# Patient Record
Sex: Female | Born: 1980 | Race: White | Hispanic: No | Smoking: Former smoker
Health system: Southern US, Community
[De-identification: ages and names within clinical notes are randomized; demographics above are authoritative.]

## PROBLEM LIST (undated history)

## (undated) DIAGNOSIS — M1990 Unspecified osteoarthritis, unspecified site: Secondary | ICD-10-CM

## (undated) DIAGNOSIS — F419 Anxiety disorder, unspecified: Secondary | ICD-10-CM

## (undated) DIAGNOSIS — F329 Major depressive disorder, single episode, unspecified: Secondary | ICD-10-CM

## (undated) DIAGNOSIS — M797 Fibromyalgia: Secondary | ICD-10-CM

## (undated) DIAGNOSIS — I10 Essential (primary) hypertension: Secondary | ICD-10-CM

## (undated) DIAGNOSIS — F32A Depression, unspecified: Secondary | ICD-10-CM

## (undated) HISTORY — DX: Major depressive disorder, single episode, unspecified: F32.9

## (undated) HISTORY — DX: Anxiety disorder, unspecified: F41.9

## (undated) HISTORY — DX: Depression, unspecified: F32.A

---

## 1998-01-23 ENCOUNTER — Inpatient Hospital Stay (HOSPITAL_COMMUNITY): Admission: AD | Admit: 1998-01-23 | Discharge: 1998-01-24 | Payer: Self-pay | Admitting: Specialist

## 1998-01-23 ENCOUNTER — Emergency Department (HOSPITAL_COMMUNITY): Admission: EM | Admit: 1998-01-23 | Discharge: 1998-01-23 | Payer: Self-pay | Admitting: Emergency Medicine

## 1998-03-04 ENCOUNTER — Emergency Department (HOSPITAL_COMMUNITY): Admission: EM | Admit: 1998-03-04 | Discharge: 1998-03-04 | Payer: Self-pay | Admitting: Emergency Medicine

## 1999-01-06 ENCOUNTER — Emergency Department (HOSPITAL_COMMUNITY): Admission: EM | Admit: 1999-01-06 | Discharge: 1999-01-06 | Payer: Self-pay | Admitting: Emergency Medicine

## 1999-02-21 ENCOUNTER — Emergency Department (HOSPITAL_COMMUNITY): Admission: EM | Admit: 1999-02-21 | Discharge: 1999-02-21 | Payer: Self-pay | Admitting: Emergency Medicine

## 1999-03-24 ENCOUNTER — Encounter: Payer: Self-pay | Admitting: Family Medicine

## 1999-03-24 ENCOUNTER — Encounter: Admission: RE | Admit: 1999-03-24 | Discharge: 1999-03-24 | Payer: Self-pay | Admitting: Family Medicine

## 1999-05-20 ENCOUNTER — Encounter: Payer: Self-pay | Admitting: *Deleted

## 1999-05-20 ENCOUNTER — Inpatient Hospital Stay (HOSPITAL_COMMUNITY): Admission: AD | Admit: 1999-05-20 | Discharge: 1999-05-20 | Payer: Self-pay | Admitting: *Deleted

## 1999-05-21 ENCOUNTER — Inpatient Hospital Stay (HOSPITAL_COMMUNITY): Admission: AD | Admit: 1999-05-21 | Discharge: 1999-05-21 | Payer: Self-pay | Admitting: Obstetrics

## 1999-06-26 ENCOUNTER — Inpatient Hospital Stay (HOSPITAL_COMMUNITY): Admission: AD | Admit: 1999-06-26 | Discharge: 1999-06-26 | Payer: Self-pay | Admitting: Obstetrics & Gynecology

## 1999-10-24 ENCOUNTER — Inpatient Hospital Stay (HOSPITAL_COMMUNITY): Admission: AD | Admit: 1999-10-24 | Discharge: 1999-10-24 | Payer: Self-pay | Admitting: Obstetrics and Gynecology

## 1999-11-06 ENCOUNTER — Other Ambulatory Visit: Admission: RE | Admit: 1999-11-06 | Discharge: 1999-11-06 | Payer: Self-pay | Admitting: Obstetrics and Gynecology

## 2000-02-02 ENCOUNTER — Inpatient Hospital Stay (HOSPITAL_COMMUNITY): Admission: AD | Admit: 2000-02-02 | Discharge: 2000-02-02 | Payer: Self-pay | Admitting: Obstetrics and Gynecology

## 2000-02-09 ENCOUNTER — Inpatient Hospital Stay (HOSPITAL_COMMUNITY): Admission: AD | Admit: 2000-02-09 | Discharge: 2000-02-09 | Payer: Self-pay | Admitting: Obstetrics and Gynecology

## 2000-03-08 ENCOUNTER — Inpatient Hospital Stay (HOSPITAL_COMMUNITY): Admission: AD | Admit: 2000-03-08 | Discharge: 2000-03-08 | Payer: Self-pay | Admitting: Obstetrics and Gynecology

## 2000-03-09 ENCOUNTER — Ambulatory Visit (HOSPITAL_COMMUNITY): Admission: RE | Admit: 2000-03-09 | Discharge: 2000-03-09 | Payer: Self-pay | Admitting: Obstetrics and Gynecology

## 2000-03-14 ENCOUNTER — Inpatient Hospital Stay (HOSPITAL_COMMUNITY): Admission: AD | Admit: 2000-03-14 | Discharge: 2000-03-14 | Payer: Self-pay | Admitting: Obstetrics and Gynecology

## 2000-03-16 ENCOUNTER — Inpatient Hospital Stay (HOSPITAL_COMMUNITY): Admission: AD | Admit: 2000-03-16 | Discharge: 2000-03-16 | Payer: Self-pay | Admitting: Obstetrics and Gynecology

## 2000-04-17 ENCOUNTER — Inpatient Hospital Stay (HOSPITAL_COMMUNITY): Admission: AD | Admit: 2000-04-17 | Discharge: 2000-04-17 | Payer: Self-pay | Admitting: *Deleted

## 2000-05-03 ENCOUNTER — Inpatient Hospital Stay (HOSPITAL_COMMUNITY): Admission: AD | Admit: 2000-05-03 | Discharge: 2000-05-03 | Payer: Self-pay | Admitting: Obstetrics and Gynecology

## 2000-05-17 ENCOUNTER — Inpatient Hospital Stay (HOSPITAL_COMMUNITY): Admission: AD | Admit: 2000-05-17 | Discharge: 2000-05-17 | Payer: Self-pay | Admitting: Obstetrics & Gynecology

## 2000-06-02 ENCOUNTER — Encounter (INDEPENDENT_AMBULATORY_CARE_PROVIDER_SITE_OTHER): Payer: Self-pay

## 2000-06-02 ENCOUNTER — Inpatient Hospital Stay (HOSPITAL_COMMUNITY): Admission: AD | Admit: 2000-06-02 | Discharge: 2000-06-04 | Payer: Self-pay | Admitting: Obstetrics and Gynecology

## 2000-07-06 ENCOUNTER — Other Ambulatory Visit: Admission: RE | Admit: 2000-07-06 | Discharge: 2000-07-06 | Payer: Self-pay | Admitting: Obstetrics and Gynecology

## 2001-02-02 ENCOUNTER — Other Ambulatory Visit: Admission: RE | Admit: 2001-02-02 | Discharge: 2001-02-02 | Payer: Self-pay | Admitting: Obstetrics and Gynecology

## 2001-10-01 ENCOUNTER — Emergency Department (HOSPITAL_COMMUNITY): Admission: EM | Admit: 2001-10-01 | Discharge: 2001-10-01 | Payer: Self-pay | Admitting: Emergency Medicine

## 2001-10-16 ENCOUNTER — Emergency Department (HOSPITAL_COMMUNITY): Admission: EM | Admit: 2001-10-16 | Discharge: 2001-10-17 | Payer: Self-pay | Admitting: Emergency Medicine

## 2001-10-17 ENCOUNTER — Encounter: Payer: Self-pay | Admitting: Emergency Medicine

## 2002-03-13 ENCOUNTER — Encounter: Payer: Self-pay | Admitting: Emergency Medicine

## 2002-03-13 ENCOUNTER — Emergency Department (HOSPITAL_COMMUNITY): Admission: EM | Admit: 2002-03-13 | Discharge: 2002-03-13 | Payer: Self-pay | Admitting: Emergency Medicine

## 2002-06-14 ENCOUNTER — Emergency Department (HOSPITAL_COMMUNITY): Admission: EM | Admit: 2002-06-14 | Discharge: 2002-06-14 | Payer: Self-pay | Admitting: Emergency Medicine

## 2002-06-21 ENCOUNTER — Inpatient Hospital Stay (HOSPITAL_COMMUNITY): Admission: AD | Admit: 2002-06-21 | Discharge: 2002-06-21 | Payer: Self-pay | Admitting: Obstetrics and Gynecology

## 2005-06-01 ENCOUNTER — Emergency Department (HOSPITAL_COMMUNITY): Admission: EM | Admit: 2005-06-01 | Discharge: 2005-06-01 | Payer: Self-pay | Admitting: Emergency Medicine

## 2006-01-16 ENCOUNTER — Emergency Department (HOSPITAL_COMMUNITY): Admission: EM | Admit: 2006-01-16 | Discharge: 2006-01-16 | Payer: Self-pay | Admitting: Emergency Medicine

## 2008-01-15 ENCOUNTER — Emergency Department (HOSPITAL_COMMUNITY): Admission: EM | Admit: 2008-01-15 | Discharge: 2008-01-15 | Payer: Self-pay | Admitting: Emergency Medicine

## 2008-05-14 ENCOUNTER — Emergency Department (HOSPITAL_COMMUNITY): Admission: EM | Admit: 2008-05-14 | Discharge: 2008-05-15 | Payer: Self-pay | Admitting: Emergency Medicine

## 2008-08-20 ENCOUNTER — Encounter: Admission: RE | Admit: 2008-08-20 | Discharge: 2008-08-20 | Payer: Self-pay | Admitting: Family Medicine

## 2008-09-07 ENCOUNTER — Encounter: Admission: RE | Admit: 2008-09-07 | Discharge: 2008-09-07 | Payer: Self-pay | Admitting: Gastroenterology

## 2008-11-04 ENCOUNTER — Emergency Department (HOSPITAL_COMMUNITY): Admission: EM | Admit: 2008-11-04 | Discharge: 2008-11-04 | Payer: Self-pay | Admitting: Emergency Medicine

## 2008-11-06 ENCOUNTER — Emergency Department (HOSPITAL_COMMUNITY): Admission: EM | Admit: 2008-11-06 | Discharge: 2008-11-06 | Payer: Self-pay | Admitting: Emergency Medicine

## 2009-01-09 ENCOUNTER — Emergency Department (HOSPITAL_COMMUNITY): Admission: EM | Admit: 2009-01-09 | Discharge: 2009-01-09 | Payer: Self-pay | Admitting: Emergency Medicine

## 2009-08-19 ENCOUNTER — Inpatient Hospital Stay (HOSPITAL_COMMUNITY): Admission: AD | Admit: 2009-08-19 | Discharge: 2009-08-19 | Payer: Self-pay | Admitting: Obstetrics & Gynecology

## 2009-08-19 ENCOUNTER — Ambulatory Visit: Payer: Self-pay | Admitting: Physician Assistant

## 2009-10-27 ENCOUNTER — Ambulatory Visit: Payer: Self-pay | Admitting: Family

## 2009-10-27 ENCOUNTER — Inpatient Hospital Stay (HOSPITAL_COMMUNITY): Admission: AD | Admit: 2009-10-27 | Discharge: 2009-10-27 | Payer: Self-pay | Admitting: Obstetrics & Gynecology

## 2010-02-19 ENCOUNTER — Encounter
Admission: RE | Admit: 2010-02-19 | Discharge: 2010-03-25 | Payer: Self-pay | Source: Home / Self Care | Attending: Certified Nurse Midwife | Admitting: Certified Nurse Midwife

## 2010-03-24 LAB — BASIC METABOLIC PANEL
BUN: 5 mg/dL — ABNORMAL LOW (ref 6–23)
CO2: 24 mEq/L (ref 19–32)
Calcium: 8.7 mg/dL (ref 8.4–10.5)
Chloride: 106 mEq/L (ref 96–112)
Creatinine, Ser: 0.54 mg/dL (ref 0.4–1.2)
GFR calc Af Amer: >60 mL/min (ref >60)
GFR calc non Af Amer: >60 mL/min (ref >60)
Glucose, Bld: 76 mg/dL (ref 70–99)
Potassium: 3.7 mEq/L (ref 3.5–5.1)
Sodium: 136 mEq/L (ref 135–145)

## 2010-03-24 LAB — CBC
HCT: 36.5 % (ref 36.0–46.0)
Hemoglobin: 12.1 g/dL (ref 12.0–15.0)
RBC: 4.38 MIL/uL (ref 3.87–5.11)
WBC: 14.2 10*3/uL — ABNORMAL HIGH (ref 4.0–10.5)

## 2010-03-24 LAB — RPR: RPR Ser Ql: NONREACTIVE

## 2010-03-24 LAB — SURGICAL PCR SCREEN: MRSA, PCR: NEGATIVE

## 2010-03-25 ENCOUNTER — Inpatient Hospital Stay (HOSPITAL_COMMUNITY)
Admission: RE | Admit: 2010-03-25 | Discharge: 2010-03-27 | DRG: 371 | Disposition: A | Discharge status: Home / Self Care | Payer: BC Managed Care – PPO | Attending: Obstetrics and Gynecology | Admitting: Obstetrics and Gynecology

## 2010-03-25 DIAGNOSIS — O99814 Abnormal glucose complicating childbirth: Secondary | ICD-10-CM | POA: Diagnosis present

## 2010-03-25 DIAGNOSIS — Z302 Encounter for sterilization: Secondary | ICD-10-CM

## 2010-03-25 DIAGNOSIS — O34219 Maternal care for unspecified type scar from previous cesarean delivery: Principal | ICD-10-CM | POA: Diagnosis present

## 2010-03-25 DIAGNOSIS — O409XX0 Polyhydramnios, unspecified trimester, not applicable or unspecified: Secondary | ICD-10-CM | POA: Diagnosis present

## 2010-03-25 DIAGNOSIS — O329XX0 Maternal care for malpresentation of fetus, unspecified, not applicable or unspecified: Secondary | ICD-10-CM | POA: Diagnosis present

## 2010-03-25 DIAGNOSIS — O3660X0 Maternal care for excessive fetal growth, unspecified trimester, not applicable or unspecified: Secondary | ICD-10-CM | POA: Diagnosis present

## 2010-03-25 DIAGNOSIS — E669 Obesity, unspecified: Secondary | ICD-10-CM | POA: Diagnosis present

## 2010-03-25 LAB — GLUCOSE, CAPILLARY: Glucose-Capillary: 81 mg/dL (ref 70–99)

## 2010-03-26 ENCOUNTER — Other Ambulatory Visit: Payer: Self-pay | Admitting: Obstetrics and Gynecology

## 2010-03-26 LAB — CBC
MCV: 84.1 fL (ref 78.0–100.0)
Platelets: 197 10*3/uL (ref 150–400)
RBC: 3.97 MIL/uL (ref 3.87–5.11)
RDW: 14.4 % (ref 11.5–15.5)
WBC: 13.9 10*3/uL — ABNORMAL HIGH (ref 4.0–10.5)

## 2010-03-27 LAB — RH IMMUNE GLOB WKUP(>/=20WKS)(NOT WOMEN'S HOSP)
Antibody Screen: POSITIVE
DAT, IgG: NEGATIVE
Unit division: 0

## 2010-03-27 LAB — GLUCOSE, CAPILLARY: Glucose-Capillary: 91 mg/dL (ref 70–99)

## 2010-04-01 NOTE — H&P (Signed)
  Monica Vance, Monica Vance            ACCOUNT NO.:  [id number]  MEDICAL RECORD NO.:  [id number]          PATIENT TYPE:  INP  LOCATION:  NA                            [facility]  [facility]  PHYSICIAN:  [name], M.D.DATE OF BIRTH:  May 02, 1980  DATE OF ADMISSION: DATE OF DISCHARGE:                             HISTORY & PHYSICAL   CHIEF COMPLAINT:  Noncompliant gestational diabetes mellitus with sonogram confirmed LGA and polyhydramnios and mature L/S, PG ratio by amniocentesis for repeat C-section and tubal ligation.  HISTORY OF PRESENT ILLNESS:  She is a 30 year old white female G3, P1 at 37-2/7 weeks' gestation with a poorly controlled gestational diabetes, noncompliance with medication.  Ultrasound estimation of fetal weight in the 94th percentile with an AFI of 27.  This case was discussed with Dr. Particia Nearing, maternal fetal medicine, who concluded based on information given that a reasonable approach to delivery was to proceed with an amniocentesis which if mature, she would then recommended to proceed with delivery based on mature amnio results.  Decision was made to proceed with attempts at delivery.  ALLERGIES:  She has allergies to LATEX.  MEDICATIONS:  Flexeril p.r.n., Fioricet p.r.n., Nexium, Phenergan p.r.n., Tums, Tylenol, and prenatal vitamins.  SOCIAL HISTORY:  She is a nonsmoker, nondrinker.  She denies domestic or physical violence.  FAMILY HISTORY:  She has a family history of stomach and lung cancer, migraine headaches, hypertension, thyroid disease, diabetes, heart disease, Down syndrome, and COPD.  PRENATAL COURSE:  Complicated by maternal obesity and noncompliance with her diabetes mellitus with poorly controlled blood sugars and ultrasounds as noted.  She has also had evidence of mild elevation of her diastolic blood pressure in the 90s on 2 occasions with normal laboratory values.  PHYSICAL EXAMINATION:  GENERAL:  The patient is a well-developed,  well- nourished white female. VITAL SIGNS:  Body mass index greater than 35. HEENT:  Normal. NECK:  Supple with full range of motion. LUNGS:  Clear. ABDOMEN:  Soft, gravid, nontender.  Estimated fetal weight is 9 pounds. Cervix is closed, 50%, vertex, -3. EXTREMITIES:  There are no cords. NEUROLOGIC:  Nonfocal. SKIN:  Intact.  IMPRESSION: 1. A 37-2/7 week intrauterine gestation. 2. Previous cesarean section with repeat tubal ligation. 3. Noncompliant gestational diabetes mellitus with noncompliance with     glucose monitoring and resultant polyhydramnios and large for     gestational age. 4. Mature amniocentesis.  PLAN:  Proceed with repeat low segment transverse cesarean section, tubal ligation.  Risks of anesthesia, infection, bleeding, injury to abdominal organs, need for repair discussed, delayed versus immediate complications to include bowel and bladder injury, possible need for repair is discussed.  Failure risk of tubal ligation of 5 to 10 per thousand noted.  The patient acknowledges and desires to proceed. Please note that due to decision to deliver this patient prior to 39 weeks, this case was discussed and management was approved by Maternal Fetal Medicine.     [name], M.D.     RJT/MEDQ  D:  03/24/2010  T:  03/25/2010  Job:  [id number]  Electronically Signed by [name] M.D. on 04/01/2010 03:00:18 PM

## 2010-04-01 NOTE — Op Note (Signed)
Monica Vance, Monica Vance            ACCOUNT NO.:  [id number]  MEDICAL RECORD NO.:  [id number]          PATIENT TYPE:  INP  LOCATION:  9103                          [facility]  [facility]  PHYSICIAN:  [name], M.D.DATE OF BIRTH:  1980-03-11  DATE OF PROCEDURE:  03/25/2010 DATE OF DISCHARGE:                              OPERATIVE REPORT   PREOPERATIVE DIAGNOSES: 1. A 37-2/7th weeks intrauterine pregnancy. 2. Poorly controlled gestational diabetes mellitus. 3. Large for gestational age with polyhydramnios. 4. Mature amniocentesis. 5. Fetal malpresentation. 6. Desire for elective sterilization.  PROCEDURES: 1. Repeat low segment transverse cesarean section. 2. Tubal ligation.  SURGEON:  [name], MD  ASSISTANT:  Arlan Organ, MD  ANESTHESIA:  Spinal by Quillian Quince, MD  ESTIMATED BLOOD LOSS:  1000 mL.  COMPLICATIONS:  None.  DRAINS:  Foley.  COUNTS:  Correct.  The patient went to recovery in good condition.  FINDINGS:  Full-term living female, Apgars 8 and 9, transverse lie converted to breech, clear amniotic fluid, single layer uterine closure, normal tubes, normal ovaries.  Tubal ligation done.  BRIEF OPERATIVE NOTE:  After being apprised of risks of anesthesia, infection, bleeding, injury to abdominal organs, need for repair, delayed versus immediate complications to include bowel and bladder injury, possible need for repair, failure risk of tubal ligation 5-10 per thousand, the patient was brought to the operating room where she was administered spinal anesthetic without complications, prepped and draped in usual sterile fashion.  Foley catheter was placed.  After achieving adequate anesthesia, dilute Marcaine solution was placed.  A Pfannenstiel skin incision was made with the scalpel, carried down to the fascia which was nicked in the midline and opened transversely with Mayo scissors.  Rectus muscles were dissected sharply in the  midline. Peritoneum was entered sharply.  Bladder blade was placed.  Visceral peritoneum was scored sharply off the lower uterine segment.  Kerr hysterotomy incision was made.  Copious amounts of clear amniotic fluid was noted upon entry into the uterus.  The fetus was spontaneously converted from a right transverse lie to a footling breech presentation and was delivered from breech position using a normal maneuvers with subsequent flexion of the fetal vertex and atraumatic delivery.  The cord was clamped and the fetus was handed over to the pediatricians who are in attendance.  Uterus was exteriorized after delivery of placenta, curetted using a dry lap pack and closed in a single running imbricating layer of 0 Monocryl suture.  Interrupted sutures in the midline placed for hemostasis.  Good hemostasis noted and attention was turned to the left tube which was traced out to its fimbriated end and ampullary isthmic portion of the tube was identified and avascular portion of the mesosalpinx was cauterized, creating a window.  A 0 plain ties were placed proximally and distally and the tubal segment was excised.  Same procedure was done on the right tube as was done on the left tube and a tubal segment was subsequently excised from the right as well.  Tubal lumens were visualized and cauterized in all four areas and good hemostasis was assured.  The uterus was then replaced  in the abdominal cavity.  Irrigation was accomplished.  Bladder flap and uterine incision were found to be hemostatic.  Urine was clear and copious.  At this time, the fascia was reapproximated using 0 Monocryl in running fashion. Subcutaneous tissue was reapproximated using a 0 plain in a continuous running fashion and the skin was closed using staples.  The patient tolerated the procedure well and was transferred to the recovery room in good condition.     [name], M.D.     RJT/MEDQ  D:  03/25/2010  T:   03/26/2010  Job:  [id number]  Electronically Signed by [name] M.D. on 04/01/2010 03:00:22 PM

## 2010-04-05 ENCOUNTER — Inpatient Hospital Stay (HOSPITAL_COMMUNITY)
Admission: AD | Admit: 2010-04-05 | Discharge: 2010-04-05 | Disposition: A | Discharge status: Home / Self Care | Payer: BC Managed Care – PPO | Source: Ambulatory Visit | Attending: Obstetrics | Admitting: Obstetrics

## 2010-04-05 DIAGNOSIS — O909 Complication of the puerperium, unspecified: Secondary | ICD-10-CM | POA: Insufficient documentation

## 2010-04-06 ENCOUNTER — Ambulatory Visit (HOSPITAL_COMMUNITY)
Admission: AD | Admit: 2010-04-06 | Discharge: 2010-04-06 | Disposition: A | Discharge status: Home / Self Care | Payer: BC Managed Care – PPO | Source: Ambulatory Visit | Attending: Obstetrics | Admitting: Obstetrics

## 2010-04-06 DIAGNOSIS — O909 Complication of the puerperium, unspecified: Secondary | ICD-10-CM | POA: Insufficient documentation

## 2010-04-24 ENCOUNTER — Other Ambulatory Visit: Payer: Self-pay | Admitting: Certified Nurse Midwife

## 2010-05-08 LAB — URINALYSIS, ROUTINE W REFLEX MICROSCOPIC
Bilirubin Urine: NEGATIVE
Ketones, ur: NEGATIVE mg/dL
Nitrite: NEGATIVE
Specific Gravity, Urine: 1.02 (ref 1.005–1.030)
Urobilinogen, UA: 0.2 mg/dL (ref 0.0–1.0)

## 2010-05-08 LAB — WET PREP, GENITAL

## 2010-05-08 LAB — URINE CULTURE
Colony Count: 30000
Culture  Setup Time: 201109051056

## 2010-05-12 LAB — URINALYSIS, ROUTINE W REFLEX MICROSCOPIC
Bilirubin Urine: NEGATIVE
Glucose, UA: NEGATIVE mg/dL
Hgb urine dipstick: NEGATIVE
Ketones, ur: NEGATIVE mg/dL
Protein, ur: NEGATIVE mg/dL
pH: 7.5 (ref 5.0–8.0)

## 2010-05-12 LAB — URINE MICROSCOPIC-ADD ON

## 2010-05-15 NOTE — Discharge Summary (Signed)
NAMELUBERTA, Monica Vance            ACCOUNT NO.:  [id number]  MEDICAL RECORD NO.:  [id number]           PATIENT TYPE:  I  LOCATION:  9103                          [facility]  [facility]  PHYSICIAN:  [name], M.D.DATE OF BIRTH:  Jan 06, 1981  DATE OF ADMISSION:  03/25/2010 DATE OF DISCHARGE:  03/27/2010                              DISCHARGE SUMMARY   ADMITTING DIAGNOSES:  A 39 weeks' gestation; previous cesarean section; gestational diabetes, not well controlled; chronic back pain; large for gestational age; and polyhydramnios.  DISCHARGE DIAGNOSES:  Postoperative day #2, status post cesarean section with bilateral tubal sterilization, chronic low back pain, and obesity.  PATIENT HISTORY:  The patient is a 30 year old gravida 3, para 1-0-1-1 at 37 weeks and 2 days with an [facility] of April 13, 2010.  The patient has received prenatal care at [facility] since 12 weeks' gestation with [name], certified nurse midwife as primary.  Prenatal labs include the patient is B negative, antibody screen negative, rubella positive, HIV negative, RPR negative, hepatitis B negative with an abnormal GTT screening consistent with gestational diabetes.  Prenatal course was complicated by obesity; chronic hypertension, mild, on no medication; gestational diabetes, not well controlled; chronic back pain; large for gestational age fetus; and polyhydramnios with an AFI of 27.  Medical and surgery history is consistent for cesarean section x1, excision of wisdom teeth, and hospitalization after ruptured disc in back.  ALLERGIES:  PENICILLIN and LATEX.  CURRENT MEDICATIONS:  Include prenatal vitamin 1 tablet daily, Nexium 20 mg p.o. daily, Phenergan 25 mg p.o. p.r.n. q.6 h. as needed for nausea and vomiting, Fioricet 1 tablet every 6 hours as needed for migraine p.r.n., Flexeril 10 mg t.i.d. p.r.n. for back pain and back spasms.  PRESENTATION:  The patient presents on the day of admission for  repeat cesarean section and bilateral tubal sterilization.  Admission CBC: White blood cell count of 14.2, hemoglobin of 12.1, hematocrit of 36.5, and a platelet count of 227.  Amniocentesis prior to admission and decision for delivery positive for fetal lung maturity in consultation with maternal fetal medicine with recommendation for delivery on the day of admission delivery.  PROCEDURE:  The patient undergoes cesarean section by Dr Billy Coast with delivery of a female, 7 pounds 4 ounces, Apgar scores of 7 and 8 in a frank breech position at 1627 with subsequent bilateral tubal sterilization by Dr. Billy Coast.  Newborn was transferred to the regular nursery.   POSTOPERATIVE COURSE:  Postpartum postop care is uneventful.Postop CBC: White blood cell count of 13.9, hemoglobin of 11.1, hematocrit of 33.4, and platelet count of 197.  Vital signs are within normal limits.  The patient is afebrile during course of hospitalization.  Physical exam is within normal limits.  Wound edges are well approximated with staples, noted erythema and skin folds under pannus of abdomen consistent with cutaneous yeast.  The patient is discharged home in stable condition on postoperative day #2.  Incision is intact with staples.  The patient to return to [facility] on April 02, 2010, for staple removal and incision check.    DISCHARGE INSTURCTIONS: The patient is on a carb-modified  sodium restricted diet.  Activity per postoperative restrictions x2 weeks.  Instructions for postpartum care per [facility] booklet.  Medications at the time of discharge include: 1) prenatal vitamin 1 tablet p.o. daily 2) Colace 1-2 tablets daily as needed for prevention of constipation 3) Vicodin 5/500 q.6 h. p.r.n. for pain 4) ibuprofen 800 mg every 8 hours as needed for discomfort 5) hydrochlorothiazide 25 mg p.o. daily x5 day 6) fluconazole 200 mg p.o. daily x14 days for cutaneous candidiasi  The patient to follow up  at 6 weeks postpartum, will need 2-hour GTT at 6-12 weeks postpartum with a history of gestational diabetes-uncontrolled.     [name], C.N.M.   ______________________________ [name], M.D.    TB/MEDQ  D:  03/29/2010  T:  03/30/2010  Job:  [id number]  Electronically Signed by [name] C.N.M. on 04/28/2010 12:32:25 PM Electronically Signed by [name] M.D. on 05/15/2010 01:27:18 PM

## 2010-06-02 ENCOUNTER — Other Ambulatory Visit: Payer: Self-pay | Admitting: Obstetrics and Gynecology

## 2010-06-05 LAB — URINE MICROSCOPIC-ADD ON

## 2010-06-05 LAB — WET PREP, GENITAL
Trich, Wet Prep: NONE SEEN
Yeast Wet Prep HPF POC: NONE SEEN

## 2010-06-05 LAB — URINALYSIS, ROUTINE W REFLEX MICROSCOPIC
Specific Gravity, Urine: 1.021 (ref 1.005–1.030)
Urobilinogen, UA: 1 mg/dL (ref 0.0–1.0)
pH: 6 (ref 5.0–8.0)

## 2010-06-05 LAB — GC/CHLAMYDIA PROBE AMP, GENITAL: Chlamydia, DNA Probe: NEGATIVE

## 2010-06-18 ENCOUNTER — Ambulatory Visit (HOSPITAL_COMMUNITY)
Admission: RE | Admit: 2010-06-18 | Discharge: 2010-06-18 | Disposition: A | Discharge status: Home / Self Care | Payer: BC Managed Care – PPO | Source: Ambulatory Visit | Attending: Obstetrics and Gynecology | Admitting: Obstetrics and Gynecology

## 2010-06-18 ENCOUNTER — Other Ambulatory Visit: Payer: Self-pay | Admitting: Obstetrics and Gynecology

## 2010-06-18 DIAGNOSIS — R87613 High grade squamous intraepithelial lesion on cytologic smear of cervix (HGSIL): Secondary | ICD-10-CM | POA: Insufficient documentation

## 2010-06-18 LAB — CBC
HCT: 44.4 % (ref 36.0–46.0)
Hemoglobin: 14.4 g/dL (ref 12.0–15.0)
MCH: 27.6 pg (ref 26.0–34.0)
MCHC: 32.4 g/dL (ref 30.0–36.0)
MCV: 85.1 fL (ref 78.0–100.0)
Platelets: 280 K/uL (ref 150–400)
RBC: 5.22 MIL/uL — ABNORMAL HIGH (ref 3.87–5.11)
RDW: 14.7 % (ref 11.5–15.5)
WBC: 11.3 K/uL — ABNORMAL HIGH (ref 4.0–10.5)

## 2010-06-18 LAB — HCG, SERUM, QUALITATIVE: Preg, Serum: NEGATIVE

## 2010-07-01 NOTE — Op Note (Signed)
  [name], Monica Vance            ACCOUNT NO.:  [id number]  MEDICAL RECORD NO.:  [id number]           PATIENT TYPE:  O  LOCATION:  [facility]                          [facility]  PHYSICIAN:  [name], M.D.DATE OF BIRTH:  02-Oct-1980  DATE OF PROCEDURE:  06/18/2010 DATE OF DISCHARGE:                              OPERATIVE REPORT   PREOPERATIVE DIAGNOSIS:  High-grade squamous intraepithelial lesion.  POSTOPERATIVE DIAGNOSIS:  High-grade squamous intraepithelial lesion.  PROCEDURE:  LEEP conization of the cervix.  SURGEON:  [name], MD  ASSISTANT:  None.  ANESTHESIA:  General and local.  ESTIMATED BLOOD LOSS:  Less than 50 mL.  COMPLICATIONS:  None.  DRAINS:  None.  COUNTS:  Correct.  The patient went to recovery in good condition.  SPECIMENS: 1. Single pass transformation zone. 2. Endocervical button to pathology.  BRIEF OPERATIVE NOTE:  After being apprised of the risks of anesthesia, infection, bleeding, injury to abdominal organs, need for repair, delayed versus immediate complications to include bowel and bladder injury, possible need for repair, the patient was brought to the operating room where she was administered general anesthetic without complications, prepped and draped in the usual sterile fashion, catheterized until the bladder was empty.  After achieving adequate anesthesia, dilute Xylocaine epinephrine solution 2% was placed with an intracervical block 4 mL total.  At this time after coating the cervix with Lugol solution, the area of the transformation zone was outlined. A coated speculum was placed and colposcope is used to magnify the area. At this time, the medium-sized LEEP loop was placed and used to take a single pass through the transformation zone. Specimen is fragmented and marked as noted.  The smaller loop was then used to excise endocervical button.  Hemostasis was achieved using the coagulation ball and Monsel  solution.  Good hemostasis noted.  The patient tolerated the procedure well, was awakened and transferred to recovery room in good condition.     [name], M.D.     RJT/MEDQ  D:  06/18/2010  T:  06/18/2010  Job:  [id number]  Electronically Signed by [name] M.D. on 07/01/2010 02:44:09 PM

## 2010-07-11 NOTE — Discharge Summary (Signed)
[facility]  Patient:    [name], Monica Vance                    MRN: [id number] Adm. Date:  [id number] Disc. Date: 06/04/00 Attending:  Cordelia Pen Ii Dictator:   Danie Chandler, R.N.                           Discharge Summary  ADMITTING DIAGNOSES:          1. Intrauterine pregnancy at 40-1/[redacted] weeks                                  gestation.                               2. Transverse lie.  DISCHARGE DIAGNOSES:          1. Intrauterine pregnancy at 40-1/[redacted] weeks                                  gestation.                               2. Transverse lie.  PROCEDURE:                    June 02, 2000: Primary low transverse cesarean section.  REASON FOR ADMISSION:         The patient is a 30 year old married white female, gravida 2, para 0, with an estimated date of confinement of June 01, 2000. Ultrasound reveals transverse lie. The patient declines version.  HOSPITAL COURSE:              The patient was taken to the operating room and underwent the above named procedure without complication. This was productive of a viable female infant with Apgars of 9 at one minute and 9 at five minutes with an arterial cord pH of 7.38. Postoperatively, on day #1, the patients hemoglobin was 11.4, hematocrit 32.5, and white blood cell count 11.6. The patient had good control of pain and was tolerating a regular diet. On postoperative day #2 she requested discharge home. She had a good return of bowel function and was ambulating well without difficulty.  CONDITION ON DISCHARGE:       Good.  DIET:                         Regular as tolerated.  ACTIVITY:                     No heavy lifting, no driving, no vaginal entry.  DISCHARGE FOLLOWUP:           She is to follow up in the office in one to two weeks for incision check and she is to call for temperature greater than 100 degrees, persistent nausea and vomiting, heavy vaginal bleeding,  and/or redness or drainage from the incision site.  DISCHARGE MEDICATIONS:        Prenatal vitamin one p.o. q.d. and pain medications as directed by M.D. DD:  06/04/00 TD:  06/04/00 Job: [id number]

## 2010-07-11 NOTE — Op Note (Signed)
[facility]  Patient:    Monica Vance, Monica Vance                    MRN: [id number] Proc. Date: 06/02/00 Adm. Date:  [id number] Disc. Date: [id number] Attending:  Minette Headland                           Operative Report  PREOPERATIVE DIAGNOSES:       1. Intrauterine pregnancy at 40-1/7 weeks.                               2. Transverse lie.  POSTOPERATIVE DIAGNOSES:      1. Intrauterine pregnancy at 40-1/7 weeks.                               2. Transverse lie.  OPERATION:                    Primary low transverse cesarean section.  SURGEON:                      Guy Sandifer. Arleta Creek, M.D.  ANESTHESIA:                   Spinal, Dorinda Hill T. Pamalee Leyden, M.D.  ESTIMATED BLOOD LOSS:         800 cc.  FINDINGS                      A viable female infant, Apgars 9/9 at 1 and 5 minutes, respectively.  Birth weight 6 pounds 10 ounces.  Arterial cord pH 7.38.  INDICATIONS AND CONSENT:      The patient is a 30 year old married white female, G2, P0, AB1, with an EDC of June 01, 2000.  Ultrasound reveals transverse lie.  The patient declines version.  Cesarean section is discussed. Potential risks and complications were discussed with the patient and father of the baby prior to surgery including but not limited to infection, bowel, bladder, or ureteral damage, bleeding requiring transfusion of blood products with possible transfusion reaction of HIV and hepatitis acquisition, DVT, PE, pneumonia.  All questions were answered, and consent was signed on the chart.  DESCRIPTION OF PROCEDURE:     The patient was taken to the operating room where spinal anesthetic is placed.  She is then placed in the dorsosupine position with a 15 degree left lateral wedge.  She is prepped abdominally. Foley catheter is then placed in the bladder as a drain, and she is draped in a sterile fashion.  After testing for adequate spinal anesthesia, skin is entered through a Pfannenstiel  incision, and dissection is carried out in layers to the peritoneum.  The peritoneum is sharply incised and extended superiorly and inferiorly.  The vesicouterine and peritoneum is taken down cephalolaterally.  The bladder flap is developed and a bladder blade is placed.  Uterus is incised in a low transverse manner, and the uterine cavity is entered bluntly with a hemostat.  The uterine incision is extended cephalolaterally with the fingers.  The fluid behind the membranes was yellowish to greenish.  A DeLee suction was then set up.  Artificial rupture of membranes for fluid with perhaps a light green tint is carried out.  The infant is in the oblique presentation  with head first.  The vertex is delivered, and the oronasal pharynx are suctioned with DeLee suction.  Nuchal cord x 1 is reduced.  The remainder of the infant is delivered.  Good cry and tone is noted, and the cord is clamped and cut, and the infant is handed to the awaiting pediatrics team.  Placenta is manually delivered and sent to pathology.  The umbilical cord is  noted to be fairly narrow.  The uterine contour is clean and normal in contour.  The uterus is closed in a running locking fashion with 0 Monocryl suture which achieved good hemostasis.  Tubes and ovaries are normal.  The anterior peritoneum is closed in running fashion with 0 Monocryl suture which is also used to reapproximate the pyramidalis muscle in the midline.  Anterior rectus fascia is closed in running fashion with 0 Panacryl suture.  The skin is closed with clips.  All sponge, instrument, and needle counts are correct, and the patient is transferred to the recovery room in stable condition. DD:  06/02/00 TD:  06/02/00 Job: [id number]

## 2010-07-22 ENCOUNTER — Ambulatory Visit (HOSPITAL_COMMUNITY)
Admission: RE | Admit: 2010-07-22 | Discharge: 2010-07-22 | Disposition: A | Discharge status: Home / Self Care | Payer: BC Managed Care – PPO | Source: Ambulatory Visit | Attending: Obstetrics and Gynecology | Admitting: Obstetrics and Gynecology

## 2010-07-22 ENCOUNTER — Other Ambulatory Visit: Payer: Self-pay | Admitting: Obstetrics and Gynecology

## 2010-07-22 DIAGNOSIS — N920 Excessive and frequent menstruation with regular cycle: Secondary | ICD-10-CM | POA: Insufficient documentation

## 2010-07-22 LAB — CBC
MCV: 83.3 fL (ref 78.0–100.0)
Platelets: 276 10*3/uL (ref 150–400)
RBC: 5.34 MIL/uL — ABNORMAL HIGH (ref 3.87–5.11)
RDW: 14.3 % (ref 11.5–15.5)
WBC: 10.4 10*3/uL (ref 4.0–10.5)

## 2010-07-22 LAB — BASIC METABOLIC PANEL
Calcium: 9.7 mg/dL (ref 8.4–10.5)
GFR calc Af Amer: >60 mL/min (ref >60)
GFR calc non Af Amer: >60 mL/min (ref >60)
Glucose, Bld: 100 mg/dL — ABNORMAL HIGH (ref 70–99)
Potassium: 4.5 mEq/L (ref 3.5–5.1)
Sodium: 139 mEq/L (ref 135–145)

## 2010-07-22 LAB — HCG, SERUM, QUALITATIVE: Preg, Serum: NEGATIVE

## 2010-07-25 NOTE — Op Note (Signed)
  NAMEATHA, Monica Vance            ACCOUNT NO.:  [id number]  MEDICAL RECORD NO.:  [id number]           PATIENT TYPE:  O  LOCATION:  [facility]                          [facility]  PHYSICIAN:  [name], M.D.DATE OF BIRTH:  [birth year]  DATE OF PROCEDURE:  07/23/2010 DATE OF DISCHARGE:                              OPERATIVE REPORT   PREOPERATIVE DIAGNOSIS:  Refractory menorrhagia.  POSTOPERATIVE DIAGNOSIS:  Refractory menorrhagia.  PROCEDURE:  Diagnostic hysteroscopy, D and C, NovaSure endometrial ablation.  SURGEON:  [name], MD  ASSISTANT:  None.  ANESTHESIA:  General by Jean Rosenthal.  ESTIMATED BLOOD LOSS:  Less than 50 mL.  FLUID DEFICIT:  60 mL.  COMPLICATIONS:  None.  DRAINS:  None.  COUNTS:  Correct.  The patient went to recovery in good condition.  BRIEF OPERATIVE NOTE:  After being apprised of the risks of anesthesia, infection, bleeding, injury to abdominal organs, need for repair, delayed versus immediate complications to include bowel and bladder injury, possible need for repair, failure risk of NovaSure discussed, inability to cure pain and/or persistent bleeding, the patient was brought to the operating room where she was administered general anesthetic without complications, prepped and draped in usual sterile fashion.  Feet were placed in the Yellofin stirrups.  Exam under anesthesia revealed a small anteflexed uterus and no adnexal masses. Pelvic exam revealed a well-healed cervix from previously performed LEEP.  At this time, cervix was easily dilated up to a #23 Pratt dilator after dilute Marcaine solution was placed in a standard paracervical block, 20 mL total of 0.25% solution.  Anterior lip having been grasped with a single-tooth tenaculum, the cervix was easily dilated as noted. She sounded to 9 cm.  NovaSure measurements were taken.  Hysteroscope was placed.  Visualization revealed a normal endometrial cavity.  No evidence of  uterine perforation, normal bilateral tubal ostia.  Pictures taken.  Sharp curettage performed in a four-quadrant method without difficulty and specimen sent for permanent section.  At this time, NovaSure device was entered, set to a length of 6.5 and a width of 4.7. CO2 test was performed and was negative.  NovaSure procedure was initiated in a standard fashion for a total of 1 minute and 5 seconds to a power of 168 W.  After this had terminated the procedure, the device was removed and inspected and found to be intact.  The uterus was reinspected and found to be well ablated with no evidence of perforation.  Good hemostasis was noted.  All instruments were removed.  The patient tolerated the procedure well and was awakened and transferred to recovery in good condition.     [name], M.D.     RJT/MEDQ  D:  07/22/2010  T:  07/23/2010  Job:  [id number]  Electronically Signed by [name] M.D. on 07/25/2010 02:02:23 PM

## 2010-09-03 ENCOUNTER — Other Ambulatory Visit: Payer: Self-pay | Admitting: Sports Medicine

## 2010-09-03 DIAGNOSIS — M549 Dorsalgia, unspecified: Secondary | ICD-10-CM

## 2010-09-04 ENCOUNTER — Ambulatory Visit
Admission: RE | Admit: 2010-09-04 | Discharge: 2010-09-04 | Disposition: A | Discharge status: Home / Self Care | Payer: BC Managed Care – PPO | Source: Ambulatory Visit | Attending: Sports Medicine | Admitting: Sports Medicine

## 2010-09-04 DIAGNOSIS — M549 Dorsalgia, unspecified: Secondary | ICD-10-CM

## 2010-09-04 MED ORDER — METHYLPREDNISOLONE ACETATE 40 MG/ML IJ SUSP
120.0000 mg | Freq: Once | INTRAMUSCULAR | Status: AC
  Administered 2010-09-04: 120 mg via INTRA_ARTICULAR

## 2010-11-27 ENCOUNTER — Other Ambulatory Visit: Payer: Self-pay | Admitting: Sports Medicine

## 2010-11-27 DIAGNOSIS — M549 Dorsalgia, unspecified: Secondary | ICD-10-CM

## 2010-12-09 ENCOUNTER — Other Ambulatory Visit: Payer: BC Managed Care – PPO

## 2011-11-30 ENCOUNTER — Ambulatory Visit (INDEPENDENT_AMBULATORY_CARE_PROVIDER_SITE_OTHER): Payer: BC Managed Care – PPO | Admitting: Family Medicine

## 2011-11-30 VITALS — BP 112/70 | HR 116 | Temp 98.2°F | Resp 16 | Ht 63.0 in | Wt 185.0 lb

## 2011-11-30 DIAGNOSIS — M797 Fibromyalgia: Secondary | ICD-10-CM | POA: Insufficient documentation

## 2011-11-30 DIAGNOSIS — IMO0001 Reserved for inherently not codable concepts without codable children: Secondary | ICD-10-CM

## 2011-11-30 DIAGNOSIS — J029 Acute pharyngitis, unspecified: Secondary | ICD-10-CM

## 2011-11-30 DIAGNOSIS — J020 Streptococcal pharyngitis: Secondary | ICD-10-CM

## 2011-11-30 DIAGNOSIS — I889 Nonspecific lymphadenitis, unspecified: Secondary | ICD-10-CM

## 2011-11-30 LAB — POCT RAPID STREP A (OFFICE): Rapid Strep A Screen: POSITIVE — AB

## 2011-11-30 MED ORDER — ACETAMINOPHEN-CODEINE 300-30 MG PO TABS: 1.0000 | ORAL_TABLET | ORAL | Status: DC | PRN

## 2011-11-30 MED ORDER — AZITHROMYCIN 250 MG PO TABS: ORAL_TABLET | ORAL | Status: DC

## 2011-11-30 NOTE — Progress Notes (Signed)
Subjective: 31 year old lady with sore throat for the last 3 days. She feels terrible she aches all over. She has a history of fibromyalgia, but this is just accentuated everything. Her ears hurt. Her neck has tender swollen nodes.  Objective: TMs are both badly scarred from old infections and tubes. Her use however not read at all. Her throat is erythematous. Strep screen was done and the strong positive. Her neck has very large anterior cervical nodes which quite tender. Chest is clear to auscultation. Heart regular without murmurs.  Assessment: Streptococcal pharyngitis Myalgias exacerbating her fibromyalgia  Plan: Is probably allergic to penicillin she'll use Zithromax. Give her a few Tylenol with Codeine for pain.

## 2011-11-30 NOTE — Patient Instructions (Signed)

## 2012-01-19 ENCOUNTER — Encounter: Payer: Self-pay | Admitting: Physical Medicine & Rehabilitation

## 2012-02-02 ENCOUNTER — Ambulatory Visit (HOSPITAL_BASED_OUTPATIENT_CLINIC_OR_DEPARTMENT_OTHER): Payer: BC Managed Care – PPO | Admitting: Physical Medicine & Rehabilitation

## 2012-02-02 ENCOUNTER — Encounter: Payer: Self-pay | Admitting: Physical Medicine & Rehabilitation

## 2012-02-02 ENCOUNTER — Encounter: Payer: BC Managed Care – PPO | Attending: Physical Medicine & Rehabilitation

## 2012-02-02 VITALS — BP 124/78 | HR 104 | Resp 16 | Ht 62.0 in | Wt 184.6 lb

## 2012-02-02 DIAGNOSIS — M533 Sacrococcygeal disorders, not elsewhere classified: Secondary | ICD-10-CM

## 2012-02-02 DIAGNOSIS — IMO0001 Reserved for inherently not codable concepts without codable children: Secondary | ICD-10-CM | POA: Insufficient documentation

## 2012-02-02 DIAGNOSIS — M545 Low back pain, unspecified: Secondary | ICD-10-CM | POA: Insufficient documentation

## 2012-02-02 DIAGNOSIS — M797 Fibromyalgia: Secondary | ICD-10-CM

## 2012-02-02 MED ORDER — GABAPENTIN 100 MG PO CAPS: 100.0000 mg | ORAL_CAPSULE | Freq: Three times a day (TID) | ORAL | Status: DC

## 2012-02-02 MED ORDER — NORTRIPTYLINE HCL 25 MG PO CAPS: 10.0000 mg | ORAL_CAPSULE | Freq: Every day | ORAL | Status: DC

## 2012-02-02 NOTE — Progress Notes (Signed)
Subjective:    Patient ID: Monica Vance, female    DOB: [birth year], 31 y.o.   MRN: [id number]  HPI Fibromyalgia syndrome Diagnosed several years ago also with chronic low back pain. MRIs have been done in 2012 and 2013 which showed no signs of progression. There was a broad-based disc at L4-L5 and mild facet arthropathy. She underwent an epidural injection at L5-S1 by physical medicine rehabilitation at [facility]. This was no relief. She underwent sacroiliac injection on the left side. I reviewed sports medicine notes which stated it was not particularly helpful however the patient did undergo sacroiliac radiofrequency in April of 2013 which did produce relief but  Seems to be wearing off at the current time. Patient has been evaluated by rheumatology as well and the diagnosis was fibromyalgia syndrome. Recommendation for coping with pain. She has been also placed on Flexeril 20 mg each bedtime and Xanax 1 mg each bedtime. Has not tried Lyrica. Has not tried gabapentin. She  takes tramadol 2 tablets 3 times per day.  Pain Inventory Average Pain 7 Pain Right Now 9 My pain is sharp, burning, dull, stabbing, tingling and aching  In the last 24 hours, has pain interfered with the following? General activity 8 Relation with others 8 Enjoyment of life 9 What TIME of day is your pain at its worst? all the time Sleep (in general) Poor  Pain is worse with: walking, bending, sitting, inactivity, standing and some activites Pain improves with: medication and TENS Relief from Meds: 7  Mobility walk without assistance how many minutes can you walk? 10-20 ability to climb steps?  yes do you drive?  yes Do you have any goals in this area?  yes  Function disabled: date disabled 04/11/2011 I need assistance with the following:  dressing, bathing, toileting, household duties and shopping Do you have any goals in this area?   yes  Neuro/Psych weakness numbness tingling spasms confusion depression anxiety  Prior Studies Any changes since last visit?  no  Physicians involved in your care Any changes since last visit?  no   Family History  Problem Relation Age of Onset  . COPD Mother   . Mental illness Mother   . Asthma Mother   . COPD Father   . Hypertension Father    History   Social History  . Marital Status: Divorced    Spouse Name: N/A    Number of Children: N/A  . Years of Education: N/A   Social History Main Topics  . Smoking status: Current Every Day Smoker -- 0.5 packs/day    Types: Cigarettes  . Smokeless tobacco: None  . Alcohol Use: None  . Drug Use: None  . Sexually Active: None   Other Topics Concern  . None   Social History Narrative  . None   Past Surgical History  Procedure Date  . Cesarean section    Past Medical History  Diagnosis Date  . Anxiety   . Depression    BP 124/78  Pulse 104  Resp 16  Ht 5\' 2"  (1.575 m)  Wt 184 lb 9.6 oz (83.734 kg)  BMI 33.76 kg/m2  SpO2 100%  LMP 01/13/2012    Review of Systems  Constitutional: Positive for appetite change and unexpected weight change.  Gastrointestinal: Positive for abdominal pain.  Musculoskeletal: Positive for back pain.  Neurological: Positive for weakness and numbness.       Spasms, tingling  Psychiatric/Behavioral: Positive for confusion and dysphoric mood. The patient is nervous/anxious.  All other systems reviewed and are negative.       Objective:   Physical Exam Fibromyalgia tender points palpated, 18/18 positive Lumbar spine range of motion is normal but has pain with extension no pain over the PSIS area. Straight leg raising test is negative Manual muscle testing 5/5 in bilateral deltoid, biceps, triceps, grip, hip flexor, knee extensors, ankle dorsiflexor and plantar flexor Gait shows no evidence of toe drag or knee instability Sensation is intact to light touch and  pinprick Mood and affect labile Upper extremity and lower extremity range of motion are normal No evidence of joint swelling       Assessment & Plan:  1. Fibromyalgia syndrome I think this is the main underlying pain problem Recommend continue tramadol 2 tablets 3 times per day Recommend continue Flexeril 20 mg at night Recommend starting nortriptyline 25 mg at night Recommend continue Celebrex 200 mg twice a day Recommend discontinue Robaxin Recommend discontinue naproxen Recommend start gabapentin 100 mg 3 times a day  Continue to stay active walking as much is possible  2. Low back pain improved with sacroiliac radiofrequency. I do think she would benefit from repeat but would like to have this done under conscious sedation. We'll see if Dr. Alvester Morin can do this at the surgical Center

## 2012-02-02 NOTE — Patient Instructions (Signed)
1. Fibromyalgia syndrome I think this is the main underlying pain problem Recommend continue tramadol 2 tablets 3 times per day Recommend continue Flexeril 20 mg at night Recommend starting nortriptyline 25 mg at night Recommend continue Celebrex 200 mg twice a day Recommend discontinue Robaxin Recommend discontinue naproxen Recommend start gabapentin 100 mg 3 times a day  Continue to stay active walking as much is possible  2. Low back pain improved with sacroiliac radiofrequency. I do think she would benefit from repeat but would like to have this done under conscious sedation. We'll see if Dr. Alvester Morin can do this at the surgical Center

## 2012-02-03 ENCOUNTER — Telehealth: Payer: Self-pay | Admitting: *Deleted

## 2012-02-03 NOTE — Telephone Encounter (Signed)
Dr. Wynn Banker prescribed Gabapentin and advised the patient to stop two of her other medications. Need to clarify which medications? She thought one of them was Celebrex but spoke with her pharmacist and they said she can take the two together. Please advise.

## 2012-02-03 NOTE — Telephone Encounter (Signed)
Dr. Alvester Morin does not do sacroiliac RF with IV Sedation.  So I called Dr. Ashok Pall office [facility]) and he does.  Appt for consultation has been set up for Friday 02/05/12 at 9am.  I left a voice mail message to the patient to let her know.

## 2012-02-03 NOTE — Telephone Encounter (Signed)
Notified patient that she can continue the Celebrex. Dr. Wynn Banker wanted her to stop the Robaxin and Naproxen.

## 2012-02-03 NOTE — Telephone Encounter (Signed)
Message copied by Sydnee Levans on Wed Feb 03, 2012  8:58 AM ------      Message from: Erick Colace      Created: Tue Feb 02, 2012 10:50 AM       Please call [facility] to see if dr newton can do sacroiliac rf with iv sedation

## 2012-02-03 NOTE — Telephone Encounter (Signed)
Message forwarded to Diane.

## 2012-02-05 ENCOUNTER — Telehealth: Payer: Self-pay

## 2012-02-05 NOTE — Telephone Encounter (Signed)
Will take up to 4weeks to take effect

## 2012-02-05 NOTE — Telephone Encounter (Signed)
Patient called to say that the nortriptyline is not working and she would like a higher does.  Please advise.

## 2012-02-08 NOTE — Telephone Encounter (Signed)
Patient informed to try to give the medication some more time to work.

## 2012-02-29 ENCOUNTER — Telehealth: Payer: Self-pay

## 2012-02-29 MED ORDER — NORTRIPTYLINE HCL 25 MG PO CAPS: 10.0000 mg | ORAL_CAPSULE | Freq: Every day | ORAL | Status: DC

## 2012-02-29 MED ORDER — CYCLOBENZAPRINE HCL 10 MG PO TABS: 10.0000 mg | ORAL_TABLET | Freq: Two times a day (BID) | ORAL | Status: DC

## 2012-02-29 MED ORDER — GABAPENTIN 100 MG PO CAPS: 100.0000 mg | ORAL_CAPSULE | Freq: Three times a day (TID) | ORAL | Status: DC

## 2012-02-29 NOTE — Telephone Encounter (Signed)
Refilled and pt notified

## 2012-02-29 NOTE — Telephone Encounter (Signed)
Needs refills on nortriptyline, gabapentin and flexeril.

## 2012-03-01 ENCOUNTER — Ambulatory Visit: Payer: BC Managed Care – PPO | Admitting: Physical Medicine & Rehabilitation

## 2012-03-08 ENCOUNTER — Encounter: Payer: Self-pay | Attending: Physical Medicine & Rehabilitation

## 2012-03-08 ENCOUNTER — Ambulatory Visit: Payer: Self-pay | Admitting: Physical Medicine & Rehabilitation

## 2012-03-08 DIAGNOSIS — M545 Low back pain, unspecified: Secondary | ICD-10-CM | POA: Insufficient documentation

## 2012-03-08 DIAGNOSIS — IMO0001 Reserved for inherently not codable concepts without codable children: Secondary | ICD-10-CM | POA: Insufficient documentation

## 2012-03-18 ENCOUNTER — Telehealth: Payer: Self-pay | Admitting: *Deleted

## 2012-03-18 NOTE — Telephone Encounter (Signed)
Patients insurance has been cancelled through her work. She is unable to afford the Celebrex. Would like to know if something else could be called in?

## 2012-06-29 ENCOUNTER — Other Ambulatory Visit: Payer: Self-pay

## 2012-08-23 ENCOUNTER — Other Ambulatory Visit: Payer: Self-pay

## 2012-08-24 ENCOUNTER — Other Ambulatory Visit: Payer: Self-pay | Admitting: *Deleted

## 2013-12-14 ENCOUNTER — Emergency Department (HOSPITAL_COMMUNITY)
Admission: EM | Admit: 2013-12-14 | Discharge: 2013-12-14 | Disposition: A | Discharge status: Home / Self Care | Payer: Self-pay | Attending: Emergency Medicine | Admitting: Emergency Medicine

## 2013-12-14 ENCOUNTER — Encounter (HOSPITAL_COMMUNITY): Payer: Self-pay | Admitting: Emergency Medicine

## 2013-12-14 DIAGNOSIS — S0993XA Unspecified injury of face, initial encounter: Secondary | ICD-10-CM | POA: Insufficient documentation

## 2013-12-14 DIAGNOSIS — Z720 Tobacco use: Secondary | ICD-10-CM | POA: Insufficient documentation

## 2013-12-14 DIAGNOSIS — Z91040 Latex allergy status: Secondary | ICD-10-CM | POA: Insufficient documentation

## 2013-12-14 DIAGNOSIS — Y9372 Activity, wrestling: Secondary | ICD-10-CM | POA: Insufficient documentation

## 2013-12-14 DIAGNOSIS — W500XXA Accidental hit or strike by another person, initial encounter: Secondary | ICD-10-CM | POA: Insufficient documentation

## 2013-12-14 DIAGNOSIS — K0889 Other specified disorders of teeth and supporting structures: Secondary | ICD-10-CM

## 2013-12-14 DIAGNOSIS — G8929 Other chronic pain: Secondary | ICD-10-CM | POA: Insufficient documentation

## 2013-12-14 DIAGNOSIS — F419 Anxiety disorder, unspecified: Secondary | ICD-10-CM | POA: Insufficient documentation

## 2013-12-14 DIAGNOSIS — F329 Major depressive disorder, single episode, unspecified: Secondary | ICD-10-CM | POA: Insufficient documentation

## 2013-12-14 DIAGNOSIS — Y9289 Other specified places as the place of occurrence of the external cause: Secondary | ICD-10-CM | POA: Insufficient documentation

## 2013-12-14 DIAGNOSIS — S025XXA Fracture of tooth (traumatic), initial encounter for closed fracture: Secondary | ICD-10-CM | POA: Insufficient documentation

## 2013-12-14 DIAGNOSIS — R110 Nausea: Secondary | ICD-10-CM | POA: Insufficient documentation

## 2013-12-14 DIAGNOSIS — Z792 Long term (current) use of antibiotics: Secondary | ICD-10-CM | POA: Insufficient documentation

## 2013-12-14 DIAGNOSIS — R51 Headache: Secondary | ICD-10-CM | POA: Insufficient documentation

## 2013-12-14 DIAGNOSIS — Z880 Allergy status to penicillin: Secondary | ICD-10-CM | POA: Insufficient documentation

## 2013-12-14 DIAGNOSIS — Z791 Long term (current) use of non-steroidal anti-inflammatories (NSAID): Secondary | ICD-10-CM | POA: Insufficient documentation

## 2013-12-14 DIAGNOSIS — Z79899 Other long term (current) drug therapy: Secondary | ICD-10-CM | POA: Insufficient documentation

## 2013-12-14 MED ORDER — OXYCODONE-ACETAMINOPHEN 5-325 MG PO TABS: 1.0000 | ORAL_TABLET | Freq: Three times a day (TID) | ORAL | Status: DC | PRN

## 2013-12-14 MED ORDER — CLINDAMYCIN HCL 150 MG PO CAPS: 450.0000 mg | ORAL_CAPSULE | Freq: Three times a day (TID) | ORAL | Status: DC

## 2013-12-14 MED ORDER — IBUPROFEN 400 MG PO TABS
800.0000 mg | ORAL_TABLET | Freq: Once | ORAL | Status: AC
  Administered 2013-12-14: 800 mg via ORAL
  Filled 2013-12-14: qty 2

## 2013-12-14 NOTE — ED Notes (Signed)
Declined W/C at D/C and was escorted to lobby by RN. 

## 2013-12-14 NOTE — Discharge Instructions (Signed)
1. Medications: percocet, clindamycin, usual home medications 2. Treatment: rest, drink plenty of fluids, take medications as prescribed 3. Follow Up: Please followup with dentistry within 1 week for discussion of your diagnoses and further evaluation after today's visit; if you do not have a primary care doctor use the resource guide provided to find one; Return to the ER for high fevers, difficulty breathing, difficulty swallowing or other concerning symptoms     Dental Fracture You have a dental fracture or injury. This can mean the tooth is loose, has a chip in the enamel or is broken. If just the outer enamel is chipped, there is a good chance the tooth will not become infected. The only treatment needed may be to smooth off a rough edge. Fractures into the deeper layers (dentin and pulp) cause greater pain and are more likely to become infected. These require you to see a dentist as soon as possible to save the tooth. Loose teeth may need to be wired or bonded with a plastic splint to hold them in place. A paste may be painted on the open area of the broken tooth to reduce the pain. Antibiotics and pain medicine may be prescribed. Choosing a soft or liquid diet and rinsing the mouth out with warm water after meals may be helpful. See your dentist as recommended. Failure to seek care or follow up with a dentist or other specialist as recommended could result in the loss of your tooth, infection, or permanent dental problems. SEEK MEDICAL CARE IF:   You have increased pain not controlled with medicines.  You have swelling around the tooth, in the face or neck.  You have bleeding which starts, continues, or gets worse.  You have a fever. Document Released: 03/19/2004 Document Revised: 05/04/2011 Document Reviewed: 01/01/2009 [facility] Patient Information 2015 [place], [place]. This information is not intended to replace advice given to you by your health care provider. Make sure you discuss any  questions you have with your health care provider.

## 2013-12-14 NOTE — ED Notes (Signed)
Upper front teeth: broken tooth.

## 2013-12-14 NOTE — ED Provider Notes (Signed)
CSN: [id number]     Arrival date & time 12/14/13  1653 History  This chart was scribed for non-physician practitioner, Dierdre Forth, PA-C, working with Audree Camel, MD, by Bronson Curb, ED Scribe. This patient was seen in room TR07C/TR07C and the patient's care was started at 5:17 PM.     Chief Complaint  Patient presents with  . Dental Injury    The history is provided by the patient and medical records. No language interpreter was used.    HPI Comments: Monica Vance is a 33 y.o. female who presents to the Emergency Department complaining of a dental injury to her upper front tooth that occurred 1 month ago after her daughter accidentally "knocked part of her tooth out". Patient reports she and her daughter were wrestling when she was struck in the mouth by her daughter's head. She reports associated nausea and severe pain onset today. She also reports HA, but states this is chronic due to her history of migraines. Pt reports today's headache is baseline and unchanged from previous or her normal headaches.  She does not wish to be evaluated for this. She denies fever, chills, or vomiting. Patient has not tried anything for pain relief. She states that she does have a dentist, however, is not currently insured and is unable to be seen at this time. Patient has history of fibromyalgia and currently takes Celebrex. Patient is allergic to penicillin and latex.  Past Medical History  Diagnosis Date  . Anxiety   . Depression    Past Surgical History  Procedure Laterality Date  . Cesarean section     Family History  Problem Relation Age of Onset  . COPD Mother   . Mental illness Mother   . Asthma Mother   . COPD Father   . Hypertension Father    History  Substance Use Topics  . Smoking status: Current Every Day Smoker -- 0.50 packs/day    Types: Cigarettes  . Smokeless tobacco: Not on file  . Alcohol Use: Not on file   OB History   Grav Para Term Preterm  Abortions TAB SAB Ect Mult Living                 Review of Systems  Constitutional: Negative for fever, chills and appetite change.  HENT: Positive for dental problem. Negative for drooling, ear pain, facial swelling, nosebleeds, postnasal drip, rhinorrhea and trouble swallowing.   Eyes: Negative for pain and redness.  Respiratory: Negative for cough and wheezing.   Cardiovascular: Negative for chest pain.  Gastrointestinal: Positive for nausea. Negative for vomiting and abdominal pain.  Musculoskeletal: Negative for neck pain and neck stiffness.  Skin: Negative for color change and rash.  Neurological: Positive for headaches (chronic). Negative for weakness and light-headedness.  All other systems reviewed and are negative.     Allergies  Latex and Penicillins  Home Medications   Prior to Admission medications   Medication Sig Start Date End Date Taking? Authorizing Provider  celecoxib (CELEBREX) 200 MG capsule Take 200 mg by mouth 2 (two) times daily.    Historical Provider, MD  clindamycin (CLEOCIN) 150 MG capsule Take 3 capsules (450 mg total) by mouth 3 (three) times daily. 12/14/13   Xzaria Teo, PA-C  cyclobenzaprine (FLEXERIL) 10 MG tablet Take 1 tablet (10 mg total) by mouth 2 (two) times daily. 02/29/12   Erick Colace, MD  gabapentin (NEURONTIN) 100 MG capsule Take 1 capsule (100 mg total) by mouth 3 (three) times  daily. 02/29/12   Erick ColaceAndrew E Kirsteins, MD  nortriptyline (PAMELOR) 25 MG capsule Take 1 capsule (25 mg total) by mouth at bedtime. 02/29/12   Erick ColaceAndrew E Kirsteins, MD  oxyCODONE-acetaminophen (PERCOCET) 5-325 MG per tablet Take 1-2 tablets by mouth every 8 (eight) hours as needed. 12/14/13   Iretha Kirley, PA-C  traMADol (ULTRAM) 50 MG tablet Take 50 mg by mouth every 6 (six) hours as needed.    Historical Provider, MD   Triage Vitals: BP 122/91  Pulse 105  Temp(Src) 98.1 F (36.7 C) (Oral)  Resp 16  Ht 5\' 4"  (1.626 m)  Wt 210 lb (95.255 kg)   BMI 36.03 kg/m2  SpO2 99%  LMP 12/14/2013  Physical Exam  Nursing note and vitals reviewed. Constitutional: She appears well-developed and well-nourished.  HENT:  Head: Normocephalic.  Right Ear: Tympanic membrane, external ear and ear canal normal.  Left Ear: Tympanic membrane, external ear and ear canal normal.  Nose: Nose normal. Right sinus exhibits no maxillary sinus tenderness and no frontal sinus tenderness. Left sinus exhibits no maxillary sinus tenderness and no frontal sinus tenderness.  Mouth/Throat: Uvula is midline, oropharynx is clear and moist and mucous membranes are normal. No oral lesions. Abnormal dentition. Dental caries present. No uvula swelling or lacerations. No oropharyngeal exudate, posterior oropharyngeal edema, posterior oropharyngeal erythema or tonsillar abscesses.    Dental caries throughout. Tooth number 10 is broken without bleeding. No gingival swelling, fluctuance or induration No gross abscess  Eyes: Conjunctivae are normal. Pupils are equal, round, and reactive to light. Right eye exhibits no discharge. Left eye exhibits no discharge.  Neck: Normal range of motion. Neck supple.  No stridor Handling secretions without difficulty No nuchal rigidity No cervical lymphadenopathy   Cardiovascular: Normal rate, regular rhythm and normal heart sounds.   Pulmonary/Chest: Effort normal. No respiratory distress.  Equal chest rise  Lymphadenopathy:    She has no cervical adenopathy.  Neurological: She is alert.  Skin: Skin is warm and dry.  Psychiatric: She has a normal mood and affect.    ED Course  Procedures (including critical care time)  DIAGNOSTIC STUDIES: Oxygen Saturation is 99% on room air, normal by my interpretation.    COORDINATION OF CARE: At 1722 Discussed treatment plan with patient which includes ABX, ibuprofen, and referral to dentist. Patient agrees.   Labs Review Labs Reviewed - No data to display  Imaging Review No results  found.   EKG Interpretation None      Medications  ibuprofen (ADVIL,MOTRIN) tablet 800 mg (800 mg Oral Given 12/14/13 1726)    Patient counseled on use of narcotic pain medications. Counseled not to combine these medications with others containing tylenol. Urged not to drink alcohol, drive, or perform any other activities that requires focus while taking these medications. The patient verbalizes understanding and agrees with the plan.  MDM   Final diagnoses:  Broken tooth, closed, initial encounter  Pain, dental   Monica Vance presents with broken tooth for > 1 month with pain beginning today.  No gross abscess.  Exam unconcerning for Ludwig's angina or spread of infection.  Will treat with penicillin and pain medicine.  Pt offered dental block for pain control, but she declines.  Urged patient to follow-up with dentist and given a list of dental resources.  I have personally reviewed patient's vitals, nursing note and any pertinent labs or imaging.  I performed an focused physical exam; undressed when appropriate .    It has been determined that  no acute conditions requiring further emergency intervention are present at this time. The patient/guardian have been advised of the diagnosis and plan. I reviewed any labs and imaging including any potential incidental findings. We have discussed signs and symptoms that warrant return to the ED and they are listed in the discharge instructions.    Vital signs are stable at discharge.   BP 122/91  Pulse 105  Temp(Src) 98.1 F (36.7 C) (Oral)  Resp 16  Ht 5\' 4"  (1.626 m)  Wt 210 lb (95.255 kg)  BMI 36.03 kg/m2  SpO2 99%  LMP 12/14/2013  I personally performed the services described in this documentation, which was scribed in my presence. The recorded information has been reviewed and is accurate.   Dahlia ClientHannah Carlene Bickley, PA-C 12/14/13 1744

## 2013-12-15 NOTE — ED Provider Notes (Signed)
Medical screening examination/treatment/procedure(s) were performed by non-physician practitioner and as supervising physician I was immediately available for consultation/collaboration.   EKG Interpretation None        Saleena Tamas T Audryana Hockenberry, MD 12/15/13 0037 

## 2013-12-16 ENCOUNTER — Emergency Department (HOSPITAL_COMMUNITY): Payer: Self-pay

## 2013-12-16 ENCOUNTER — Encounter (HOSPITAL_COMMUNITY): Payer: Self-pay | Admitting: Emergency Medicine

## 2013-12-16 ENCOUNTER — Emergency Department (HOSPITAL_COMMUNITY)
Admission: EM | Admit: 2013-12-16 | Discharge: 2013-12-16 | Disposition: A | Discharge status: Home / Self Care | Payer: Self-pay | Attending: Emergency Medicine | Admitting: Emergency Medicine

## 2013-12-16 DIAGNOSIS — Z79899 Other long term (current) drug therapy: Secondary | ICD-10-CM | POA: Insufficient documentation

## 2013-12-16 DIAGNOSIS — M25511 Pain in right shoulder: Secondary | ICD-10-CM | POA: Insufficient documentation

## 2013-12-16 DIAGNOSIS — Z792 Long term (current) use of antibiotics: Secondary | ICD-10-CM | POA: Insufficient documentation

## 2013-12-16 DIAGNOSIS — Z791 Long term (current) use of non-steroidal anti-inflammatories (NSAID): Secondary | ICD-10-CM | POA: Insufficient documentation

## 2013-12-16 DIAGNOSIS — Z91040 Latex allergy status: Secondary | ICD-10-CM | POA: Insufficient documentation

## 2013-12-16 DIAGNOSIS — F419 Anxiety disorder, unspecified: Secondary | ICD-10-CM | POA: Insufficient documentation

## 2013-12-16 DIAGNOSIS — F329 Major depressive disorder, single episode, unspecified: Secondary | ICD-10-CM | POA: Insufficient documentation

## 2013-12-16 DIAGNOSIS — Z720 Tobacco use: Secondary | ICD-10-CM | POA: Insufficient documentation

## 2013-12-16 DIAGNOSIS — Z880 Allergy status to penicillin: Secondary | ICD-10-CM | POA: Insufficient documentation

## 2013-12-16 DIAGNOSIS — M1990 Unspecified osteoarthritis, unspecified site: Secondary | ICD-10-CM | POA: Insufficient documentation

## 2013-12-16 HISTORY — DX: Unspecified osteoarthritis, unspecified site: M19.90

## 2013-12-16 MED ORDER — KETOROLAC TROMETHAMINE 60 MG/2ML IM SOLN
60.0000 mg | Freq: Once | INTRAMUSCULAR | Status: AC
  Administered 2013-12-16: 60 mg via INTRAMUSCULAR
  Filled 2013-12-16: qty 2

## 2013-12-16 MED ORDER — OXYCODONE-ACETAMINOPHEN 5-325 MG PO TABS
2.0000 | ORAL_TABLET | Freq: Once | ORAL | Status: AC
  Administered 2013-12-16: 2 via ORAL
  Filled 2013-12-16: qty 2

## 2013-12-16 MED ORDER — MELOXICAM 7.5 MG PO TABS: 7.5000 mg | ORAL_TABLET | Freq: Every day | ORAL | Status: DC

## 2013-12-16 MED ORDER — HYDROCODONE-ACETAMINOPHEN 5-325 MG PO TABS: 1.0000 | ORAL_TABLET | Freq: Four times a day (QID) | ORAL | Status: DC | PRN

## 2013-12-16 NOTE — ED Notes (Signed)
Declined W/C at D/C and was escorted to lobby by RN. 

## 2013-12-16 NOTE — Discharge Instructions (Signed)
Acromioclavicular Injuries °The acromioclavicular (AC) joint is the joint in the shoulder. There are many bands of tissue (ligaments) that surround the AC bones and joints. These bands of tissue can tear, which can lead to sprains and separations. The bones of the AC joint can also break (fracture).  °HOME CARE  °· Put ice on the injured area. °¨ Put ice in a plastic bag. °¨ Place a towel between your skin and the bag. °¨ Leave the ice on for 15-20 minutes, 03-04 times a day. °· Wear your sling as told by your doctor. Remove the sling before showering and bathing. Keep the shoulder in the same place as when the sling is on. Do not lift the arm. °· Gently tighten your figure-eight splint (if applied) every day. Tighten it enough to keep the shoulders held back. There should be room to place your finger between your body and the strap. Loosen the splint right away if you lose feeling (numbness) or have tingling in your hands. °· Only take medicine as told by your doctor. °· Keep all follow-up visits with your doctor. °GET HELP RIGHT AWAY IF:  °· Your medicine does not help your pain. °· You have more puffiness (swelling) or your bruising gets worse rather than better. °· You were unable to follow up as told by your doctor. °· You have tingling or lose even more feeling in your arm, forearm, or hand. °· Your arm is cold or pale. °· You have more pain in the hand, forearm, or fingers. °MAKE SURE YOU:  °· Understand these instructions. °· Will watch your condition. °· Will get help right away if you are not doing well or get worse. °Document Released: 07/30/2009 Document Revised: 05/04/2011 Document Reviewed: 07/30/2009 °ExitCare® Patient Information ©2015 Exit[employer], LLC. This information is not intended to replace advice given to you by your health care provider. Make sure you discuss any questions you have with your health care provider. ° °

## 2013-12-16 NOTE — ED Notes (Signed)
She states her R shoulder and upper back have been hurting since lifting her toddler this am. Ambulatory, mae. Hx ddd, took her flexeril and celbrex with no relief.

## 2013-12-16 NOTE — ED Provider Notes (Signed)
CSN: [id number]     Arrival date & time 12/16/13  1634 History   First MD Initiated Contact with Patient 12/16/13 1655     Chief Complaint  Patient presents with  . Shoulder Pain    RT     (Consider location/radiation/quality/duration/timing/severity/associated sxs/prior Treatment) HPI Pt is a 33yo female c/o severe, constant, right shoulder pain after picking up her toddler earlier this morning.  States pain is 10/10 and believes it is dislocated but denies previous hx of shoulder dislocations.  States she took flexeril and celebrex w/o relief. No other injuries. Denies numbness or tingling in arm.    Past Medical History  Diagnosis Date  . Anxiety   . Depression   . Arthritis    Past Surgical History  Procedure Laterality Date  . Cesarean section     Family History  Problem Relation Age of Onset  . COPD Mother   . Mental illness Mother   . Asthma Mother   . COPD Father   . Hypertension Father    History  Substance Use Topics  . Smoking status: Current Every Day Smoker -- 0.50 packs/day    Types: Cigarettes  . Smokeless tobacco: Not on file  . Alcohol Use: No   OB History   Grav Para Term Preterm Abortions TAB SAB Ect Mult Living                 Review of Systems  Musculoskeletal: Positive for arthralgias, back pain (right upper back) and myalgias. Negative for gait problem, joint swelling, neck pain and neck stiffness.       Right shoulder  Skin: Negative for color change and wound.  All other systems reviewed and are negative.     Allergies  Latex and Penicillins  Home Medications   Prior to Admission medications   Medication Sig Start Date End Date Taking? Authorizing Provider  celecoxib (CELEBREX) 200 MG capsule Take 200 mg by mouth 2 (two) times daily.    Historical Provider, MD  clindamycin (CLEOCIN) 150 MG capsule Take 3 capsules (450 mg total) by mouth 3 (three) times daily. 12/14/13   Hannah Muthersbaugh, PA-C  cyclobenzaprine (FLEXERIL) 10 MG  tablet Take 1 tablet (10 mg total) by mouth 2 (two) times daily. 02/29/12   Erick ColaceAndrew E Kirsteins, MD  gabapentin (NEURONTIN) 100 MG capsule Take 1 capsule (100 mg total) by mouth 3 (three) times daily. 02/29/12   Erick ColaceAndrew E Kirsteins, MD  nortriptyline (PAMELOR) 25 MG capsule Take 1 capsule (25 mg total) by mouth at bedtime. 02/29/12   Erick ColaceAndrew E Kirsteins, MD  oxyCODONE-acetaminophen (PERCOCET) 5-325 MG per tablet Take 1-2 tablets by mouth every 8 (eight) hours as needed. 12/14/13   Hannah Muthersbaugh, PA-C  traMADol (ULTRAM) 50 MG tablet Take 50 mg by mouth every 6 (six) hours as needed.    Historical Provider, MD   BP 123/83  Pulse 98  Temp(Src) 98.7 F (37.1 C) (Oral)  Resp 16  SpO2 100%  LMP 12/12/2013 Physical Exam  Nursing note and vitals reviewed. Constitutional: She is oriented to person, place, and time. She appears well-developed and well-nourished.  Morbidly obese pt sitting in exam chair resting right arm in her lap, appears uncomfortable.  HENT:  Head: Normocephalic and atraumatic.  Eyes: EOM are normal.  Neck: Normal range of motion.  Cardiovascular: Normal rate.   Pulmonary/Chest: Effort normal.  Musculoskeletal: She exhibits tenderness. She exhibits no edema.  Right shoulder: no obvious deformity, tenderness to anterior and posterior aspect, decreased abduction  due to pain but able to touch left shoulder with right hand.  FROM right elbow and wrist. 4/5 grip strength right hand vs left.  Neurological: She is alert and oriented to person, place, and time.  Skin: Skin is warm and dry.  Psychiatric: She has a normal mood and affect. Her behavior is normal.    ED Course  Procedures (including critical care time) Labs Review Labs Reviewed - No data to display  Imaging Review Dg Shoulder Right  12/16/2013   CLINICAL DATA:  Right shoulder pain/ injury  EXAM: RIGHT SHOULDER - 2+ VIEW  COMPARISON:  None.  FINDINGS: No fracture or dislocation is seen.  The joint spaces are  preserved.  The visualized soft tissues are unremarkable.  Visualized right lung is clear.  IMPRESSION: No fracture or dislocation is seen.   Electronically Signed   By: Charline BillsSriyesh  Krishnan M.D.   On: 12/16/2013 18:37     EKG Interpretation None      MDM   Final diagnoses:  Right shoulder pain   Right shoulder pain after picking up toddler, tender to palpation, decreased ROM but neurovascularly in tact. No obvious deformity, doubt dislocation, however plain films obtained due to limited exam by body habitus.   Plain films: no fracture or dislocation seen.  6:55 PM went to discuss films with pt, pt picked up 2-3yo child with both arms w/o difficulty. Will discharge pt home with mobic. Advised to f/u with her PCP in 3-4 days for recheck of symptoms if not improving.  Pt verbalized understanding and agreement with tx plan.    Junius FinnerErin O'Malley, PA-C 12/16/13 [phone]

## 2013-12-17 NOTE — ED Provider Notes (Signed)
Medical screening examination/treatment/procedure(s) were performed by non-physician practitioner and as supervising physician I was immediately available for consultation/collaboration.   EKG Interpretation None        Akash Winski, MD 12/17/13 1106 

## 2013-12-23 ENCOUNTER — Emergency Department (HOSPITAL_COMMUNITY)
Admission: EM | Admit: 2013-12-23 | Discharge: 2013-12-23 | Disposition: A | Discharge status: Home / Self Care | Payer: Self-pay | Attending: Emergency Medicine | Admitting: Emergency Medicine

## 2013-12-23 ENCOUNTER — Encounter (HOSPITAL_COMMUNITY): Payer: Self-pay | Admitting: Emergency Medicine

## 2013-12-23 ENCOUNTER — Emergency Department (HOSPITAL_COMMUNITY): Payer: Self-pay

## 2013-12-23 DIAGNOSIS — M79604 Pain in right leg: Secondary | ICD-10-CM | POA: Insufficient documentation

## 2013-12-23 DIAGNOSIS — F329 Major depressive disorder, single episode, unspecified: Secondary | ICD-10-CM | POA: Insufficient documentation

## 2013-12-23 DIAGNOSIS — Z3202 Encounter for pregnancy test, result negative: Secondary | ICD-10-CM | POA: Insufficient documentation

## 2013-12-23 DIAGNOSIS — Z880 Allergy status to penicillin: Secondary | ICD-10-CM | POA: Insufficient documentation

## 2013-12-23 DIAGNOSIS — Z91040 Latex allergy status: Secondary | ICD-10-CM | POA: Insufficient documentation

## 2013-12-23 DIAGNOSIS — Z720 Tobacco use: Secondary | ICD-10-CM | POA: Insufficient documentation

## 2013-12-23 DIAGNOSIS — M79605 Pain in left leg: Secondary | ICD-10-CM | POA: Insufficient documentation

## 2013-12-23 DIAGNOSIS — M1990 Unspecified osteoarthritis, unspecified site: Secondary | ICD-10-CM | POA: Insufficient documentation

## 2013-12-23 DIAGNOSIS — Z792 Long term (current) use of antibiotics: Secondary | ICD-10-CM | POA: Insufficient documentation

## 2013-12-23 DIAGNOSIS — Z79899 Other long term (current) drug therapy: Secondary | ICD-10-CM | POA: Insufficient documentation

## 2013-12-23 DIAGNOSIS — M79606 Pain in leg, unspecified: Secondary | ICD-10-CM

## 2013-12-23 DIAGNOSIS — M791 Myalgia, unspecified site: Secondary | ICD-10-CM

## 2013-12-23 DIAGNOSIS — Z791 Long term (current) use of non-steroidal anti-inflammatories (NSAID): Secondary | ICD-10-CM | POA: Insufficient documentation

## 2013-12-23 DIAGNOSIS — F419 Anxiety disorder, unspecified: Secondary | ICD-10-CM | POA: Insufficient documentation

## 2013-12-23 LAB — CBC WITH DIFFERENTIAL/PLATELET
BASOS ABS: 0 10*3/uL (ref 0.0–0.1)
BASOS PCT: 0 % (ref 0–1)
EOS ABS: 0 10*3/uL (ref 0.0–0.7)
EOS PCT: 0 % (ref 0–5)
HEMATOCRIT: 40.4 % (ref 36.0–46.0)
Hemoglobin: 13.9 g/dL (ref 12.0–15.0)
Lymphocytes Relative: 42 % (ref 12–46)
Lymphs Abs: 2.9 10*3/uL (ref 0.7–4.0)
MCH: 28.1 pg (ref 26.0–34.0)
MCHC: 34.4 g/dL (ref 30.0–36.0)
MCV: 81.8 fL (ref 78.0–100.0)
MONO ABS: 0.5 10*3/uL (ref 0.1–1.0)
MONOS PCT: 7 % (ref 3–12)
Neutro Abs: 3.5 10*3/uL (ref 1.7–7.7)
Neutrophils Relative %: 51 % (ref 43–77)
Platelets: 260 10*3/uL (ref 150–400)
RBC: 4.94 MIL/uL (ref 3.87–5.11)
RDW: 12.6 % (ref 11.5–15.5)
WBC: 7 10*3/uL (ref 4.0–10.5)

## 2013-12-23 LAB — BASIC METABOLIC PANEL
ANION GAP: 13 (ref 5–15)
BUN: 8 mg/dL (ref 6–23)
CALCIUM: 8.9 mg/dL (ref 8.4–10.5)
CHLORIDE: 101 meq/L (ref 96–112)
CO2: 24 mEq/L (ref 19–32)
Creatinine, Ser: 0.58 mg/dL (ref 0.50–1.10)
GFR calc Af Amer: >90 mL/min (ref >90)
Glucose, Bld: 113 mg/dL — ABNORMAL HIGH (ref 70–99)
Potassium: 3.7 mEq/L (ref 3.7–5.3)
Sodium: 138 mEq/L (ref 137–147)

## 2013-12-23 LAB — POC URINE PREG, ED: Preg Test, Ur: NEGATIVE

## 2013-12-23 LAB — CK: Total CK: 95 U/L (ref 7–177)

## 2013-12-23 LAB — D-DIMER, QUANTITATIVE: D-Dimer, Quant: 0.43 ug/mL-FEU (ref 0.00–0.48)

## 2013-12-23 MED ORDER — SODIUM CHLORIDE 0.9 % IV BOLUS (SEPSIS)
1000.0000 mL | Freq: Once | INTRAVENOUS | Status: AC
  Administered 2013-12-23: 1000 mL via INTRAVENOUS

## 2013-12-23 MED ORDER — MORPHINE SULFATE 4 MG/ML IJ SOLN
4.0000 mg | Freq: Once | INTRAMUSCULAR | Status: AC
  Administered 2013-12-23: 4 mg via INTRAVENOUS
  Filled 2013-12-23: qty 1

## 2013-12-23 NOTE — ED Notes (Addendum)
Quit smoking 9/14, denies BCP, woke with bilateral calf burning at 0000MN, 2nd episode of stabbing pain in bilateral calves at 0200. Denies CP. Admits to being sob with 2nd episode. Discomfort fluctuates, but never goes away completely. Calves soft, supple, equally warm. CMS & ROM intact. Used in haler at 0200.

## 2013-12-23 NOTE — ED Provider Notes (Signed)
CSN: [id number]     Arrival date & time 12/23/13  0242 History   First MD Initiated Contact with Patient 12/23/13 [phone]     Chief Complaint  Patient presents with  . Leg Pain     (Consider location/radiation/quality/duration/timing/severity/associated sxs/prior Treatment) HPI 33 year old female presents with a chief complaint of bilateral leg cramps. She states her pain initially started 2 weeks ago and is different than her typical fibromyalgia pain. This morning around midnight she states she was awoken with severe cramping pain in bilateral calves. No pain in her thighs. About hour later she also had acute onset of shortness of breath that resolved with albuterol inhaler. She's not had use her inhaler in quite some time ever since she quit smoking. Patient denies any leg swelling. No prior history of DVT but states it "runs in my family". Patient denies a current dyspnea. There is never any chest pain. No injuries. No weakness or numbness.  Past Medical History  Diagnosis Date  . Anxiety   . Depression   . Arthritis    Past Surgical History  Procedure Laterality Date  . Cesarean section     Family History  Problem Relation Age of Onset  . COPD Mother   . Mental illness Mother   . Asthma Mother   . COPD Father   . Hypertension Father    History  Substance Use Topics  . Smoking status: Current Every Day Smoker -- 0.50 packs/day    Types: Cigarettes  . Smokeless tobacco: Not on file  . Alcohol Use: No   OB History   Grav Para Term Preterm Abortions TAB SAB Ect Mult Living                 Review of Systems  Constitutional: Negative for fever.  Respiratory: Positive for shortness of breath.   Cardiovascular: Negative for chest pain and leg swelling.  Gastrointestinal: Negative for vomiting and abdominal pain.  Musculoskeletal: Positive for myalgias.  All other systems reviewed and are negative.     Allergies  Latex and Penicillins  Home Medications   Prior to  Admission medications   Medication Sig Start Date End Date Taking? Authorizing Provider  albuterol (PROVENTIL HFA;VENTOLIN HFA) 108 (90 BASE) MCG/ACT inhaler Inhale 2 puffs into the lungs every 6 (six) hours as needed for wheezing or shortness of breath.   Yes Historical Provider, MD  aspirin-acetaminophen-caffeine (EXCEDRIN MIGRAINE) [phone] MG per tablet Take 2 tablets by mouth daily.   Yes Historical Provider, MD  celecoxib (CELEBREX) 200 MG capsule Take 200 mg by mouth 2 (two) times daily.   Yes Historical Provider, MD  cyclobenzaprine (FLEXERIL) 10 MG tablet Take 1 tablet (10 mg total) by mouth 2 (two) times daily. 02/29/12  Yes Erick ColaceAndrew E Kirsteins, MD  Gabapentin, PHN, (GRALISE) 300 MG TABS Take 900 mg by mouth daily.   Yes Historical Provider, MD  HYDROcodone-acetaminophen (NORCO) 10-325 MG per tablet Take 1-2 tablets by mouth every 6 (six) hours as needed for moderate pain.   Yes Historical Provider, MD  Tapentadol HCl (NUCYNTA ER) 250 MG TB12 Take 250 mg by mouth 2 (two) times daily.   Yes Historical Provider, MD  clindamycin (CLEOCIN) 150 MG capsule Take 3 capsules (450 mg total) by mouth 3 (three) times daily. 12/14/13   Hannah Muthersbaugh, PA-C  meloxicam (MOBIC) 7.5 MG tablet Take 1 tablet (7.5 mg total) by mouth daily. 12/16/13   Junius FinnerErin O'Malley, PA-C   BP 107/55  Pulse 91  Temp(Src) 97.7 F (  36.5 C)  Resp 16  SpO2 100%  LMP 12/12/2013 Physical Exam  Nursing note and vitals reviewed. Constitutional: She is oriented to person, place, and time. She appears well-developed and well-nourished.  HENT:  Head: Normocephalic and atraumatic.  Right Ear: External ear normal.  Left Ear: External ear normal.  Nose: Nose normal.  Eyes: Right eye exhibits no discharge. Left eye exhibits no discharge.  Cardiovascular: Normal rate, regular rhythm and normal heart sounds.   Pulses:      Dorsalis pedis pulses are 2+ on the right side, and 2+ on the left side.  Pulmonary/Chest: Effort normal  and breath sounds normal. She has no wheezes. She has no rales.  Abdominal: Soft. There is no tenderness.  Musculoskeletal:       Right upper leg: She exhibits no tenderness and no swelling.       Left upper leg: She exhibits no tenderness and no swelling.       Right lower leg: She exhibits tenderness. She exhibits no swelling.       Left lower leg: She exhibits tenderness. She exhibits no swelling.  Patient with diffuse, mild tenderness to calves. No swelling, crepitus or focal tenderness. Patient moves legs without difficulty and crosses them comfortably after exam.  Neurological: She is alert and oriented to person, place, and time.  Skin: Skin is warm and dry.    ED Course  Procedures (including critical care time) Labs Review Labs Reviewed  BASIC METABOLIC PANEL - Abnormal; Notable for the following:    Glucose, Bld 113 (*)    All other components within normal limits  CBC WITH DIFFERENTIAL  D-DIMER, QUANTITATIVE  CK  POC URINE PREG, ED    Imaging Review Dg Chest 2 View  12/23/2013   CLINICAL DATA:  Bilateral lower leg pain.  Shortness of breath.  EXAM: CHEST  2 VIEW  COMPARISON:  None.  FINDINGS: There is slight peribronchial thickening. Heart size and pulmonary vascularity are normal. No infiltrates or effusions. No osseous abnormality.  IMPRESSION: Slight peribronchial thickening. This may represent bronchitis. The patient reports that she uses an inhaler.   Electronically Signed   By: Geanie CooleyJim  Maxwell M.D.   On: 12/23/2013 05:32     EKG Interpretation None      Date: 12/23/2013  Rate: 91  Rhythm: normal sinus rhythm  QRS Axis: normal  Intervals: normal  ST/T Wave abnormalities: nonspecific T wave changes  Conduction Disutrbances:none  Narrative Interpretation:   Old EKG Reviewed: none available    MDM   Final diagnoses:  Leg pain  Myalgia    Patient's bilateral calf pain is of uncertain etiology. She is low risk for blood clot, and would be unlikely given  she has bilateral symptoms. She has a negative d-dimer and thus I feel this is ruled out. Patient's exam shows mild tenderness but no other concerning findings. She is able to ambulate without difficulty. Her pain is under control at this time. I have low suspicion that she has DVT/PE. Unlikely to be rhabdomyolysis with negative CK. More likely is related to her chronic fibromyalgia. At this point will treat pain and recommend follow-up with PCP for longer term control.    Audree CamelScott T Samhitha Rosen, MD 12/23/13 [phone]

## 2013-12-23 NOTE — ED Notes (Signed)
For 2 weeks she has had bi=lateral leg pain and sob for 2 weeks.  No sob at present..  lmp one week

## 2013-12-23 NOTE — Discharge Instructions (Signed)
Leg Cramps Leg cramps that occur during exercise can be caused by poor circulation or dehydration. However, muscle cramps that occur at rest or during the night are usually not due to any serious medical problem. Heat cramps may cause muscle spasms during hot weather.  CAUSES There is no clear cause for muscle cramps. However, dehydration may be a factor for those who do not drink enough fluids and those who exercise in the heat. Imbalances in the level of sodium, potassium, calcium or magnesium in the muscle tissue may also be a factor. Some medications, such as water pills (diuretics), may cause loss of chemicals that the body needs (like sodium and potassium) and cause muscle cramps. TREATMENT   Make sure your diet has enough fluids and essential minerals for the muscle to work normally.  Avoid strenuous exercise for several days if you have been having frequent leg cramps.  Stretch and massage the cramped muscle for several minutes.  Some medicines may be helpful in some patients with night cramps. Only take over-the-counter or prescription medicines as directed by your caregiver. SEEK IMMEDIATE MEDICAL CARE IF:   Your leg cramps become worse.  Your foot becomes cold, numb, or blue. Document Released: 03/19/2004 Document Revised: 05/04/2011 Document Reviewed: 03/06/2008 [facility] Patient Information 2015 [place], [place]. This information is not intended to replace advice given to you by your health care provider. Make sure you discuss any questions you have with your health care provider.    Muscle Pain Muscle pain (myalgia) may be caused by many things, including:  Overuse or muscle strain, especially if you are not in shape. This is the most common cause of muscle pain.  Injury.  Bruises.  Viruses, such as the flu.  Infectious diseases.  Fibromyalgia, which is a chronic condition that causes muscle tenderness, fatigue, and headache.  Autoimmune diseases, including  lupus.  Certain drugs, including ACE inhibitors and statins. Muscle pain may be mild or severe. In most cases, the pain lasts only a short time and goes away without treatment. To diagnose the cause of your muscle pain, your health care provider will take your medical history. This means he or she will ask you when your muscle pain began and what has been happening. If you have not had muscle pain for very long, your health care provider may want to wait before doing much testing. If your muscle pain has lasted a long time, your health care provider may want to run tests right away. If your health care provider thinks your muscle pain may be caused by illness, you may need to have additional tests to rule out certain conditions.  Treatment for muscle pain depends on the cause. Home care is often enough to relieve muscle pain. Your health care provider may also prescribe anti-inflammatory medicine. HOME CARE INSTRUCTIONS Watch your condition for any changes. The following actions may help to lessen any discomfort you are feeling:  Only take over-the-counter or prescription medicines as directed by your health care provider.  Apply ice to the sore muscle:  Put ice in a plastic bag.  Place a towel between your skin and the bag.  Leave the ice on for 15-20 minutes, 3-4 times a day.  You may alternate applying hot and cold packs to the muscle as directed by your health care provider.  If overuse is causing your muscle pain, slow down your activities until the pain goes away.  Remember that it is normal to feel some muscle pain after starting a  workout program. Muscles that have not been used often will be sore at first.  Do regular, gentle exercises if you are not usually active.  Warm up before exercising to lower your risk of muscle pain.  Do not continue working out if the pain is very bad. Bad pain could mean you have injured a muscle. SEEK MEDICAL CARE IF:  Your muscle pain gets worse,  and medicines do not help.  You have muscle pain that lasts longer than 3 days.  You have a rash or fever along with muscle pain.  You have muscle pain after a tick bite.  You have muscle pain while working out, even though you are in good physical condition.  You have redness, soreness, or swelling along with muscle pain.  You have muscle pain after starting a new medicine or changing the dose of a medicine. SEEK IMMEDIATE MEDICAL CARE IF:  You have trouble breathing.  You have trouble swallowing.  You have muscle pain along with a stiff neck, fever, and vomiting.  You have severe muscle weakness or cannot move part of your body. MAKE SURE YOU:   Understand these instructions.  Will watch your condition.  Will get help right away if you are not doing well or get worse. Document Released: 01/01/2006 Document Revised: 02/14/2013 Document Reviewed: 12/06/2012 [facility] Patient Information 2015 [place], [place]. This information is not intended to replace advice given to you by your health care provider. Make sure you discuss any questions you have with your health care provider.

## 2013-12-25 ENCOUNTER — Emergency Department (HOSPITAL_COMMUNITY)
Admission: EM | Admit: 2013-12-25 | Discharge: 2013-12-25 | Disposition: A | Discharge status: Home / Self Care | Payer: Self-pay | Attending: Emergency Medicine | Admitting: Emergency Medicine

## 2013-12-25 ENCOUNTER — Encounter (HOSPITAL_COMMUNITY): Payer: Self-pay

## 2013-12-25 ENCOUNTER — Emergency Department (HOSPITAL_COMMUNITY): Payer: Self-pay

## 2013-12-25 DIAGNOSIS — Z720 Tobacco use: Secondary | ICD-10-CM | POA: Insufficient documentation

## 2013-12-25 DIAGNOSIS — Z91040 Latex allergy status: Secondary | ICD-10-CM | POA: Insufficient documentation

## 2013-12-25 DIAGNOSIS — Z792 Long term (current) use of antibiotics: Secondary | ICD-10-CM | POA: Insufficient documentation

## 2013-12-25 DIAGNOSIS — S91312A Laceration without foreign body, left foot, initial encounter: Secondary | ICD-10-CM | POA: Insufficient documentation

## 2013-12-25 DIAGNOSIS — Y9389 Activity, other specified: Secondary | ICD-10-CM | POA: Insufficient documentation

## 2013-12-25 DIAGNOSIS — S99922A Unspecified injury of left foot, initial encounter: Secondary | ICD-10-CM | POA: Insufficient documentation

## 2013-12-25 DIAGNOSIS — Z79899 Other long term (current) drug therapy: Secondary | ICD-10-CM | POA: Insufficient documentation

## 2013-12-25 DIAGNOSIS — Z880 Allergy status to penicillin: Secondary | ICD-10-CM | POA: Insufficient documentation

## 2013-12-25 DIAGNOSIS — W208XXA Other cause of strike by thrown, projected or falling object, initial encounter: Secondary | ICD-10-CM | POA: Insufficient documentation

## 2013-12-25 DIAGNOSIS — M1990 Unspecified osteoarthritis, unspecified site: Secondary | ICD-10-CM | POA: Insufficient documentation

## 2013-12-25 DIAGNOSIS — Y9289 Other specified places as the place of occurrence of the external cause: Secondary | ICD-10-CM | POA: Insufficient documentation

## 2013-12-25 DIAGNOSIS — Z7982 Long term (current) use of aspirin: Secondary | ICD-10-CM | POA: Insufficient documentation

## 2013-12-25 DIAGNOSIS — F419 Anxiety disorder, unspecified: Secondary | ICD-10-CM | POA: Insufficient documentation

## 2013-12-25 DIAGNOSIS — Z791 Long term (current) use of non-steroidal anti-inflammatories (NSAID): Secondary | ICD-10-CM | POA: Insufficient documentation

## 2013-12-25 DIAGNOSIS — R110 Nausea: Secondary | ICD-10-CM | POA: Insufficient documentation

## 2013-12-25 NOTE — ED Provider Notes (Signed)
CSN: [id number]     Arrival date & time 12/25/13  1723 History  This chart was scribed for non-physician practitioner working with Linwood DibblesJon Knapp, MD by Elveria Risingimelie Horne, ED Scribe. This patient was seen in room WTR6/WTR6 and the patient's care was started at [facility]6:42 PM.   Chief Complaint  Patient presents with  . Foot Pain   The history is provided by the patient. No language interpreter was used.   HPI Comments: Monica Vance is a 33 y.o. female brought in by ambulance, who presents to the Emergency Department complaining of left foot pain and laceration after injury incurred last night. Patient reports that her mother's O2 tank fell on her foot. Patient denies treatment with medication but reports icing her foot last night. Patient locates pain at the dorsum of her left foot. Patient's pain is exacerbated with ambulation and moving her digits. Patient reports that she has been limping to avoid bearing weight. Patient presents with mild swelling and a superficial 1 cm laceration on the dorsum of her foot. Patient reports that her pain is so severe she is nauseated.  Mildly tachycardia on arrival.    Past Medical History  Diagnosis Date  . Anxiety   . Depression   . Arthritis    Past Surgical History  Procedure Laterality Date  . Cesarean section     Family History  Problem Relation Age of Onset  . COPD Mother   . Mental illness Mother   . Asthma Mother   . COPD Father   . Hypertension Father    History  Substance Use Topics  . Smoking status: Current Every Day Smoker -- 0.50 packs/day    Types: Cigarettes  . Smokeless tobacco: Not on file  . Alcohol Use: No   OB History    No data available     Review of Systems  Constitutional: Negative for fever and chills.  Gastrointestinal: Positive for nausea. Negative for vomiting.  Skin: Positive for wound.  Neurological: Negative for weakness and numbness.  All other systems reviewed and are negative.   Allergies  Latex and  Penicillins  Home Medications   Prior to Admission medications   Medication Sig Start Date End Date Taking? Authorizing Provider  albuterol (PROVENTIL HFA;VENTOLIN HFA) 108 (90 BASE) MCG/ACT inhaler Inhale 2 puffs into the lungs every 6 (six) hours as needed for wheezing or shortness of breath.    Historical Provider, MD  aspirin-acetaminophen-caffeine (EXCEDRIN MIGRAINE) [phone] MG per tablet Take 2 tablets by mouth daily.    Historical Provider, MD  celecoxib (CELEBREX) 200 MG capsule Take 200 mg by mouth 2 (two) times daily.    Historical Provider, MD  clindamycin (CLEOCIN) 150 MG capsule Take 3 capsules (450 mg total) by mouth 3 (three) times daily. 12/14/13   Hannah Muthersbaugh, PA-C  cyclobenzaprine (FLEXERIL) 10 MG tablet Take 1 tablet (10 mg total) by mouth 2 (two) times daily. 02/29/12   Erick ColaceAndrew E Kirsteins, MD  Gabapentin, PHN, (GRALISE) 300 MG TABS Take 900 mg by mouth daily.    Historical Provider, MD  HYDROcodone-acetaminophen (NORCO) 10-325 MG per tablet Take 1-2 tablets by mouth every 6 (six) hours as needed for moderate pain.    Historical Provider, MD  meloxicam (MOBIC) 7.5 MG tablet Take 1 tablet (7.5 mg total) by mouth daily. 12/16/13   Junius FinnerErin O'Malley, PA-C  Tapentadol HCl (NUCYNTA ER) 250 MG TB12 Take 250 mg by mouth 2 (two) times daily.    Historical Provider, MD   Triage Vitals:  BP 135/83 mmHg  Pulse 118  Temp(Src) 98.3 F (36.8 C) (Oral)  Resp 16  SpO2 98%  LMP 12/12/2013   Physical Exam  Constitutional: She is oriented to person, place, and time. She appears well-developed and well-nourished. No distress.  HENT:  Head: Normocephalic and atraumatic.  Mouth/Throat: Oropharynx is clear and moist.  Eyes: Conjunctivae and EOM are normal. Pupils are equal, round, and reactive to light.  Neck: Normal range of motion. Neck supple.  Cardiovascular: Normal rate, regular rhythm and normal heart sounds.   Pulmonary/Chest: Effort normal and breath sounds normal.   Musculoskeletal: Normal range of motion. She exhibits tenderness.       Left foot: There is tenderness, swelling and laceration.  1 cm superficial laceration on dorsum of the left foot, no active bleeding or signs of infection; full ROM of foot or left ankle  Neurological: She is alert and oriented to person, place, and time.  Skin: Skin is warm and dry. She is not diaphoretic.  Psychiatric: She has a normal mood and affect.  Nursing note and vitals reviewed.   ED Course  Procedures (including critical care time)  COORDINATION OF CARE: 6:46 PM- Patient advised to rest, ice and elevate the foot. plans to discharge with pain medication. Discussed treatment plan with patient at bedside and patient agreed to plan.   Labs Review Labs Reviewed - No data to display  Imaging Review Dg Foot Complete Left  12/25/2013   CLINICAL DATA:  Left foot pain after an oxygen tank fell on top of the foot yesterday.  EXAM: LEFT FOOT - COMPLETE 3+ VIEW  COMPARISON:  None.  FINDINGS: Mild spur formation at the first MTP joint. Dorsal soft tissue swelling in the mid and proximal portion of the foot. Calcaneal spurs. No fracture or dislocation seen.  IMPRESSION: 1. No fracture. 2. Calcaneal spurs and mild first MTP joint degenerative changes.   Electronically Signed   By: Gordan PaymentSteve  Reid M.D.   On: 12/25/2013 18:36     EKG Interpretation None      MDM   Final diagnoses:  Foot injury, left, initial encounter   Left foot injury after dropping O2 tank on it last night.  Patient has 1cm superficial laceration to dorsum of foot, no bleeding or signs of infection.  Do not feel laceration needs formal repair.  Imaging negative for acute fx or dislocation.  Patient ambulating in the ED multiple times without difficulty.  Encouraged RICE routine at home, continue home pain meds.  FU with PCP.  Discussed plan with patient, he/she acknowledged understanding and agreed with plan of care.  Return precautions given for new  or worsening symptoms.  Of note, patient tachycardic in the ED-- likely due to pain.  I personally performed the services described in this documentation, which was scribed in my presence. The recorded information has been reviewed and is accurate.  Garlon HatchetLisa M Kasarah Sitts, PA-C 12/25/13 1905

## 2013-12-25 NOTE — ED Notes (Signed)
Per EMS, Pt, from home, c/o L foot pain and laceration after an O2 cylinder fell on, it last night.   Pain score 10/10.  Pt was ambulatory on scene.

## 2013-12-25 NOTE — Discharge Instructions (Signed)
X-ray today negative for acute fracture. Recommend to ice and elevate foot at home to help with pain and/or swelling. Follow-up with your primary care physician. Return to the ED for new concerns.

## 2013-12-27 ENCOUNTER — Emergency Department (HOSPITAL_COMMUNITY)
Admission: EM | Admit: 2013-12-27 | Discharge: 2013-12-27 | Disposition: A | Discharge status: Home / Self Care | Payer: Self-pay | Attending: Emergency Medicine | Admitting: Emergency Medicine

## 2013-12-27 DIAGNOSIS — Z720 Tobacco use: Secondary | ICD-10-CM | POA: Insufficient documentation

## 2013-12-27 DIAGNOSIS — K0889 Other specified disorders of teeth and supporting structures: Secondary | ICD-10-CM

## 2013-12-27 DIAGNOSIS — F419 Anxiety disorder, unspecified: Secondary | ICD-10-CM | POA: Insufficient documentation

## 2013-12-27 DIAGNOSIS — M1990 Unspecified osteoarthritis, unspecified site: Secondary | ICD-10-CM | POA: Insufficient documentation

## 2013-12-27 DIAGNOSIS — Z91040 Latex allergy status: Secondary | ICD-10-CM | POA: Insufficient documentation

## 2013-12-27 DIAGNOSIS — Z792 Long term (current) use of antibiotics: Secondary | ICD-10-CM | POA: Insufficient documentation

## 2013-12-27 DIAGNOSIS — K029 Dental caries, unspecified: Secondary | ICD-10-CM | POA: Insufficient documentation

## 2013-12-27 DIAGNOSIS — K088 Other specified disorders of teeth and supporting structures: Secondary | ICD-10-CM | POA: Insufficient documentation

## 2013-12-27 DIAGNOSIS — Z880 Allergy status to penicillin: Secondary | ICD-10-CM | POA: Insufficient documentation

## 2013-12-27 DIAGNOSIS — Z79899 Other long term (current) drug therapy: Secondary | ICD-10-CM | POA: Insufficient documentation

## 2013-12-27 DIAGNOSIS — Z791 Long term (current) use of non-steroidal anti-inflammatories (NSAID): Secondary | ICD-10-CM | POA: Insufficient documentation

## 2013-12-27 MED ORDER — BUPIVACAINE-EPINEPHRINE (PF) 0.5% -1:200000 IJ SOLN
1.8000 mL | Freq: Once | INTRAMUSCULAR | Status: AC
  Administered 2013-12-27: 1.8 mL
  Filled 2013-12-27: qty 1.8

## 2013-12-27 NOTE — ED Notes (Signed)
Pt escorted to discharge window. Pt verbalized understanding discharge instructions. In no acute distress.  

## 2013-12-27 NOTE — ED Notes (Signed)
Bed: WTR6 Expected date:  Expected time:  Means of arrival:  Comments: EMS- dental pain, broken tooth

## 2013-12-27 NOTE — Discharge Instructions (Signed)
Follow-up with your dentist.  Dental Pain A tooth ache may be caused by cavities (tooth decay). Cavities expose the nerve of the tooth to air and hot or cold temperatures. It may come from an infection or abscess (also called a boil or furuncle) around your tooth. It is also often caused by dental caries (tooth decay). This causes the pain you are having. DIAGNOSIS  Your caregiver can diagnose this problem by exam. TREATMENT   If caused by an infection, it may be treated with medications which kill germs (antibiotics) and pain medications as prescribed by your caregiver. Take medications as directed.  Only take over-the-counter or prescription medicines for pain, discomfort, or fever as directed by your caregiver.  Whether the tooth ache today is caused by infection or dental disease, you should see your dentist as soon as possible for further care. SEEK MEDICAL CARE IF: The exam and treatment you received today has been provided on an emergency basis only. This is not a substitute for complete medical or dental care. If your problem worsens or new problems (symptoms) appear, and you are unable to meet with your dentist, call or return to this location. SEEK IMMEDIATE MEDICAL CARE IF:   You have a fever.  You develop redness and swelling of your face, jaw, or neck.  You are unable to open your mouth.  You have severe pain uncontrolled by pain medicine. MAKE SURE YOU:   Understand these instructions.  Will watch your condition.  Will get help right away if you are not doing well or get worse. Document Released: 02/09/2005 Document Revised: 05/04/2011 Document Reviewed: 09/28/2007 [facility] Patient Information 2015 [place], [place]. This information is not intended to replace advice given to you by your health care provider. Make sure you discuss any questions you have with your health care provider.  Dental Care and Dentist Visits Dental care supports good overall health. Regular  dental visits can also help you avoid dental pain, bleeding, infection, and other more serious health problems in the future. It is important to keep the mouth healthy because diseases in the teeth, gums, and other oral tissues can spread to other areas of the body. Some problems, such as diabetes, heart disease, and pre-term labor have been associated with poor oral health.  See your dentist every 6 months. If you experience emergency problems such as a toothache or broken tooth, go to the dentist right away. If you see your dentist regularly, you may catch problems early. It is easier to be treated for problems in the early stages.  WHAT TO EXPECT AT A DENTIST VISIT  Your dentist will look for many common oral health problems and recommend proper treatment. At your regular dental visit, you can expect:  Gentle cleaning of the teeth and gums. This includes scraping and polishing. This helps to remove the sticky substance around the teeth and gums (plaque). Plaque forms in the mouth shortly after eating. Over time, plaque hardens on the teeth as tartar. If tartar is not removed regularly, it can cause problems. Cleaning also helps remove stains.  Periodic X-rays. These pictures of the teeth and supporting bone will help your dentist assess the health of your teeth.  Periodic fluoride treatments. Fluoride is a natural mineral shown to help strengthen teeth. Fluoride treatmentinvolves applying a fluoride gel or varnish to the teeth. It is most commonly done in children.  Examination of the mouth, tongue, jaws, teeth, and gums to look for any oral health problems, such as:  Cavities (dental caries). This is decay on the tooth caused by plaque, sugar, and acid in the mouth. It is best to catch a cavity when it is small.  Inflammation of the gums caused by plaque buildup (gingivitis).  Problems with the mouth or malformed or misaligned teeth.  Oral cancer or other diseases of the soft tissues or  jaws. KEEP YOUR TEETH AND GUMS HEALTHY For healthy teeth and gums, follow these general guidelines as well as your dentist's specific advice:  Have your teeth professionally cleaned at the dentist every 6 months.  Brush twice daily with a fluoride toothpaste.  Floss your teeth daily.  Ask your dentist if you need fluoride supplements, treatments, or fluoride toothpaste.  Eat a healthy diet. Reduce foods and drinks with added sugar.  Avoid smoking. TREATMENT FOR ORAL HEALTH PROBLEMS If you have oral health problems, treatment varies depending on the conditions present in your teeth and gums.  Your caregiver will most likely recommend good oral hygiene at each visit.  For cavities, gingivitis, or other oral health disease, your caregiver will perform a procedure to treat the problem. This is typically done at a separate appointment. Sometimes your caregiver will refer you to another dental specialist for specific tooth problems or for surgery. SEEK IMMEDIATE DENTAL CARE IF:  You have pain, bleeding, or soreness in the gum, tooth, jaw, or mouth area.  A permanent tooth becomes loose or separated from the gum socket.  You experience a blow or injury to the mouth or jaw area. Document Released: 10/22/2010 Document Revised: 05/04/2011 Document Reviewed: 10/22/2010 [facility] Patient Information 2015 [place], [place]. This information is not intended to replace advice given to you by your health care provider. Make sure you discuss any questions you have with your health care provider.  Diet and Dental Disease What you eat affects the health of your teeth. Diet plays an important role in developing healthy teeth and preventing dental disease, such as:  Tooth decay.  Gum (periodontal) disease.  Developmental defects of the enamel. This is when visible surfaces of the tooth do not form properly, leaving the tooth more prone to decay.  Dental erosion. This is when the teeth wear  away. Knowing which foods promote strong teeth and which foods to stay away from can help you prevent poor oral health. If your diet lacks proper nutrients, it may be difficult for the tissues in your mouth to prevent dental disease. FOODS THAT PROMOTE DENTAL DISEASE The following foods either contain acids or create acid in your mouth that increases the risk of tooth decay:  Sugary foods, such as candy and baked goods (cookies, cake).  Soft drinks (carbonated and non-carbonated) such as soda, sports drinks, and fruit juice.  Citrus fruits, such as oranges and lemons.  Berries.  Honey.  Herbal teas that contain berries and other fruits.  Wines and other alcoholic beverages.  Vinegar or vinegar containing foods, such as pickles.  Starchy snacks such as crackers, potato chips, [place] fries, and pasta. Some of these foods have health benefits. Eat these foods in moderation. The more often you eat these foods, the more frequently you are exposing your teeth to the acid that causes dental diseases. FOODS THAT REDUCE THE RISK OF DENTAL DISEASE Certain foods help to keep the teeth strong and reduce the risk of tooth decay. These foods include:  Dairy products, such as cow's milk and cheese. Eating dairy with a meal or sugary snack reduces the risk of tooth decay.  Gums  and foods that substitute sugar with sorbitol, mannitol, and xylitol.  Fluoride containing foods, such as black tea. Fluoride is a natural mineral that protects the teeth from tooth decay. Your caregiver may recommend fluoride toothpaste or a fluoride supplement.  Breast milk. DIETARY RECOMMENDATIONS FOR HEALTHY TEETH  Eat a healthy, well-balanced diet with fiber-rich fruits and vegetables and quality proteins (eggs, meat, poultry, and fish). A variety of foods each day in moderation is best.  Avoid frequent sugary snacks in between meals.  Avoid frequent sticky, chewy, sugary candies, such as gummy bears and other  candies that stick to the teeth. Avoid sucking on candies for a long time.  Avoid drinks that contain added sugar. Even though they do not sit in the mouth for very long, they can promote tooth decay if consumed too frequently.  Avoid sugary foods and drinks late at night.  Avoid swishing or holding acidic or sugary drinks in your mouth. Using a straw limits contact with the teeth.  If you like frequent sugary treats, try eating a sugary dessert after a meal or with a dairy product, rather than eating it by itself.  Avoid starchy foods such as graham crackers that stick to your teeth.  Eat highly acidic and sugary foods in moderation, especially if you tend to develop tooth decay. Eat citrus fruits or drinks 2 times per day or less. Limit foods with vinegar and sports drinks to 1 time per week.  Try rinsing your mouth with water after a sugary or acidic meal or drink. Rinsing may help to reduce the acid buildup in the mouth.  Limit alcohol.  Read labels to determine the amount of sugar in foods. PRACTICE GOOD DAILY ORAL HYGIENE   Have your teeth professionally cleaned at the dentist every 6 months.  Brush twice daily with a fluoride toothpaste.  Floss between your teeth daily.  Ask your caregiver if you need fluoride supplements or treatments.  Ask your caregiver if you should have sealants applied to some of your teeth. HOME CARE INSTRUCTIONS  Follow the guidelines included here to promote good oral health.  Follow all of your caregiver's instructions for managing your health condition(s).  See your caregiver for follow-up exams as directed. Document Released: 10/08/2010 Document Revised: 05/04/2011 Document Reviewed: 10/08/2010 [facility] Patient Information 2015 [place]. This information is not intended to replace advice given to you by your health care provider. Make sure you discuss any questions you have with your health care provider.
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
## 2013-12-30 ENCOUNTER — Encounter (HOSPITAL_COMMUNITY): Payer: Self-pay | Admitting: *Deleted

## 2013-12-30 ENCOUNTER — Emergency Department (HOSPITAL_COMMUNITY)
Admission: EM | Admit: 2013-12-30 | Discharge: 2013-12-30 | Disposition: A | Discharge status: Home / Self Care | Payer: Self-pay | Attending: Emergency Medicine | Admitting: Emergency Medicine

## 2013-12-30 DIAGNOSIS — M79606 Pain in leg, unspecified: Secondary | ICD-10-CM

## 2013-12-30 DIAGNOSIS — Z792 Long term (current) use of antibiotics: Secondary | ICD-10-CM | POA: Insufficient documentation

## 2013-12-30 DIAGNOSIS — Z8659 Personal history of other mental and behavioral disorders: Secondary | ICD-10-CM | POA: Insufficient documentation

## 2013-12-30 DIAGNOSIS — M797 Fibromyalgia: Secondary | ICD-10-CM | POA: Insufficient documentation

## 2013-12-30 DIAGNOSIS — M79604 Pain in right leg: Secondary | ICD-10-CM | POA: Insufficient documentation

## 2013-12-30 DIAGNOSIS — H539 Unspecified visual disturbance: Secondary | ICD-10-CM | POA: Insufficient documentation

## 2013-12-30 DIAGNOSIS — Z791 Long term (current) use of non-steroidal anti-inflammatories (NSAID): Secondary | ICD-10-CM | POA: Insufficient documentation

## 2013-12-30 DIAGNOSIS — Z91040 Latex allergy status: Secondary | ICD-10-CM | POA: Insufficient documentation

## 2013-12-30 DIAGNOSIS — M79605 Pain in left leg: Secondary | ICD-10-CM | POA: Insufficient documentation

## 2013-12-30 DIAGNOSIS — Z880 Allergy status to penicillin: Secondary | ICD-10-CM | POA: Insufficient documentation

## 2013-12-30 DIAGNOSIS — Z7982 Long term (current) use of aspirin: Secondary | ICD-10-CM | POA: Insufficient documentation

## 2013-12-30 DIAGNOSIS — F1193 Opioid use, unspecified with withdrawal: Secondary | ICD-10-CM | POA: Insufficient documentation

## 2013-12-30 DIAGNOSIS — R110 Nausea: Secondary | ICD-10-CM | POA: Insufficient documentation

## 2013-12-30 DIAGNOSIS — M2550 Pain in unspecified joint: Secondary | ICD-10-CM | POA: Insufficient documentation

## 2013-12-30 HISTORY — DX: Fibromyalgia: M79.7

## 2013-12-30 LAB — BASIC METABOLIC PANEL
Anion gap: 12 (ref 5–15)
BUN: 8 mg/dL (ref 6–23)
CALCIUM: 9.6 mg/dL (ref 8.4–10.5)
CHLORIDE: 103 meq/L (ref 96–112)
CO2: 25 mEq/L (ref 19–32)
CREATININE: 0.67 mg/dL (ref 0.50–1.10)
GFR calc Af Amer: >90 mL/min (ref >90)
GFR calc non Af Amer: >90 mL/min (ref >90)
GLUCOSE: 108 mg/dL — AB (ref 70–99)
Potassium: 3.6 mEq/L — ABNORMAL LOW (ref 3.7–5.3)
Sodium: 140 mEq/L (ref 137–147)

## 2013-12-30 LAB — CK: Total CK: 75 U/L (ref 7–177)

## 2013-12-30 MED ORDER — OXYCODONE HCL 5 MG PO TABS
10.0000 mg | ORAL_TABLET | Freq: Once | ORAL | Status: AC
  Administered 2013-12-30: 10 mg via ORAL
  Filled 2013-12-30: qty 2

## 2013-12-30 MED ORDER — DICYCLOMINE HCL 10 MG PO CAPS
10.0000 mg | ORAL_CAPSULE | Freq: Once | ORAL | Status: AC
  Administered 2013-12-30: 10 mg via ORAL
  Filled 2013-12-30: qty 1

## 2013-12-30 MED ORDER — ONDANSETRON 4 MG PO TBDP
4.0000 mg | ORAL_TABLET | Freq: Once | ORAL | Status: AC
  Administered 2013-12-30: 4 mg via ORAL
  Filled 2013-12-30: qty 1

## 2013-12-30 NOTE — ED Provider Notes (Signed)
CSN: [id number]     Arrival date & time 12/30/13  1501 History   First MD Initiated Contact with Patient 12/30/13 1614     Chief Complaint  Patient presents with  . Leg Pain  . Withdrawal     (Consider location/radiation/quality/duration/timing/severity/associated sxs/prior Treatment) HPI  Monica Vance is a 33 y.o. female with a history of fibromyalgia , depression, degenerative disc disease and anxiety comes in for evaluation for her leg pain and concern for opiate withdrawal. Patient states she recently lost her insurance and has been unable to fill her prescriptions for her narcotic pain medicine. She reports bilateral whole leg pain that is exactly the same as her chronic leg pain that she has had "for years". She does report having Norco tens at home "that do nothing for me", she also reports having taken a friend's Percocet earlier this morning without relief.  She endorses associated nausea, diarrhea (without blood) for the past week. Reports intermittent episodes of blurred vision. She denies fevers, headache, chest pain, shortness of breath, vomiting, abdominal pain, numbness or weakness. She does not have a primary care. [place] [facility] [place] [facility] [place] [place] [facility] [place], reports last saw him in Sept. "Usually writes 2 prescriptions at a time." Past Medical History  Diagnosis Date  . Anxiety   . Depression   . Arthritis   . Fibromyalgia    Past Surgical History  Procedure Laterality Date  . Cesarean section     Family History  Problem Relation Age of Onset  . COPD Mother   . Mental illness Mother   . Asthma Mother   . COPD Father   . Hypertension Father    History  Substance Use Topics  . Smoking status: Current Every Day Smoker -- 0.50 packs/day    Types: Cigarettes  . Smokeless tobacco: Not on file  . Alcohol Use: No   OB History    No data available     Review of Systems  Constitutional: Negative for fever.  HENT: Negative for sore throat.   Eyes:  Positive for visual disturbance.  Respiratory: Negative for shortness of breath.   Cardiovascular: Negative for chest pain.  Gastrointestinal: Positive for nausea and diarrhea. Negative for abdominal pain.  Endocrine: Negative for polyuria.  Genitourinary: Negative for dysuria.  Musculoskeletal: Positive for myalgias and arthralgias.  Skin: Negative for rash.  Neurological: Negative for headaches.      Allergies  Latex and Penicillins  Home Medications   Prior to Admission medications   Medication Sig Start Date End Date Taking? Authorizing Provider  albuterol (PROVENTIL HFA;VENTOLIN HFA) 108 (90 BASE) MCG/ACT inhaler Inhale 2 puffs into the lungs every 6 (six) hours as needed for wheezing or shortness of breath.    Historical Provider, MD  aspirin-acetaminophen-caffeine (EXCEDRIN MIGRAINE) [phone] MG per tablet Take 2 tablets by mouth daily.    Historical Provider, MD  celecoxib (CELEBREX) 200 MG capsule Take 200 mg by mouth 2 (two) times daily.    Historical Provider, MD  clindamycin (CLEOCIN) 150 MG capsule Take 3 capsules (450 mg total) by mouth 3 (three) times daily. 12/14/13   Hannah Muthersbaugh, PA-C  cyclobenzaprine (FLEXERIL) 10 MG tablet Take 1 tablet (10 mg total) by mouth 2 (two) times daily. 02/29/12   Erick ColaceAndrew E Kirsteins, MD  Gabapentin, PHN, (GRALISE) 300 MG TABS Take 900 mg by mouth daily.    Historical Provider, MD  HYDROcodone-acetaminophen (NORCO) 10-325 MG per tablet Take 1-2 tablets by mouth every 6 (six) hours as  needed for moderate pain.    Historical Provider, MD  meloxicam (MOBIC) 7.5 MG tablet Take 1 tablet (7.5 mg total) by mouth daily. 12/16/13   Junius FinnerErin O'Malley, PA-C  Tapentadol HCl (NUCYNTA ER) 250 MG TB12 Take 250 mg by mouth 2 (two) times daily.    Historical Provider, MD   BP 129/87 mmHg  Pulse 105  Temp(Src) 97.9 F (36.6 C)  Resp 16  Ht 5\' 3"  (1.6 m)  SpO2 99%  LMP 12/12/2013 Physical Exam  Constitutional: She is oriented to person, place, and  time. She appears well-developed and well-nourished.  Patient appears in no apparent distress at this time.  HENT:  Head: Normocephalic and atraumatic.  Mouth/Throat: Oropharynx is clear and moist.  Eyes: Conjunctivae are normal. Pupils are equal, round, and reactive to light. Right eye exhibits no discharge. Left eye exhibits no discharge. No scleral icterus.  Neck: Neck supple.  Cardiovascular: Regular rhythm and normal heart sounds.   Mildly tachycardic on exam  Pulmonary/Chest: Effort normal and breath sounds normal. No respiratory distress. She has no wheezes. She has no rales.  Abdominal: Soft. There is no tenderness.  Musculoskeletal: She exhibits no tenderness.  Patient is able to ambulate all 4 extremities without any appreciable difficulty  Neurological: She is alert and oriented to person, place, and time.  Cranial Nerves II-XII grossly intact. No focal neurodeficits. Patient's gait appears baseline without any appreciable ataxia.  Skin: Skin is warm and dry. No rash noted.  Psychiatric: She has a normal mood and affect.  Nursing note and vitals reviewed.   ED Course  Procedures (including critical care time) Labs Review Labs Reviewed  BASIC METABOLIC PANEL - Abnormal; Notable for the following:    Potassium 3.6 (*)    Glucose, Bld 108 (*)    All other components within normal limits  CK    Imaging Review No results found.   EKG Interpretation None      MDM   Patient eloped prior to receiving results from lab testing. Left after receiving pain medication--told nursing staff,  "My ride is here"  Vitals stable - WNL -afebrile, tachycardia resolved Pt resting comfortably in ED. Pain managed in ED. Patient tolerating PO Likely not withdrawing from opiates as she had narcotic pain medicine today. Nausea and diarrhea have been going on for a week and her likely due to a viral syndrome. No evidence of dehydration or other emergent pathology PE not concerning for  other acute or emergent pathology.  Labwork noncontributory. No evidence of rhabdomyolysis or other acute pathology  Prior to patient discharge, I discussed and reviewed this case with Dr.Rancour   Final diagnoses:  Pain of lower extremity, unspecified laterality        Sharlene MottsBenjamin W Ayslin Kundert, PA-C 12/30/13 1834  Glynn OctaveStephen Rancour, MD 12/30/13 [phone]

## 2013-12-30 NOTE — ED Notes (Signed)
Notified EDP pt requesting to go home.

## 2013-12-30 NOTE — ED Notes (Signed)
Pt reports having bilateral leg pain. Pt has fibromyalgia and reports recently loosing her insurance and had to stop all meds suddenly. Pt thinks she is withdrawing from her pain medications due to pain, nausea, diarrhea and blurred vision. No acute distress noted at triage.

## 2013-12-30 NOTE — ED Notes (Signed)
Pt was not in room or no where within the facility. Pt left without receiving lab results. EDP notified.

## 2014-01-01 LAB — I-STAT TROPONIN, ED: Troponin i, poc: 0.01 ng/mL (ref 0.00–0.08)

## 2014-01-09 ENCOUNTER — Emergency Department (HOSPITAL_COMMUNITY)
Admission: EM | Admit: 2014-01-09 | Discharge: 2014-01-09 | Discharge status: Against Medical Advice | Payer: Self-pay | Attending: Emergency Medicine | Admitting: Emergency Medicine

## 2014-01-09 ENCOUNTER — Encounter (HOSPITAL_COMMUNITY): Payer: Self-pay | Admitting: Emergency Medicine

## 2014-01-09 DIAGNOSIS — Y9289 Other specified places as the place of occurrence of the external cause: Secondary | ICD-10-CM | POA: Insufficient documentation

## 2014-01-09 DIAGNOSIS — Z880 Allergy status to penicillin: Secondary | ICD-10-CM | POA: Insufficient documentation

## 2014-01-09 DIAGNOSIS — W19XXXA Unspecified fall, initial encounter: Secondary | ICD-10-CM

## 2014-01-09 DIAGNOSIS — M1990 Unspecified osteoarthritis, unspecified site: Secondary | ICD-10-CM | POA: Insufficient documentation

## 2014-01-09 DIAGNOSIS — Z8659 Personal history of other mental and behavioral disorders: Secondary | ICD-10-CM | POA: Insufficient documentation

## 2014-01-09 DIAGNOSIS — M79604 Pain in right leg: Secondary | ICD-10-CM | POA: Insufficient documentation

## 2014-01-09 DIAGNOSIS — Z720 Tobacco use: Secondary | ICD-10-CM | POA: Insufficient documentation

## 2014-01-09 DIAGNOSIS — R55 Syncope and collapse: Secondary | ICD-10-CM | POA: Insufficient documentation

## 2014-01-09 DIAGNOSIS — Z79899 Other long term (current) drug therapy: Secondary | ICD-10-CM | POA: Insufficient documentation

## 2014-01-09 DIAGNOSIS — Z91040 Latex allergy status: Secondary | ICD-10-CM | POA: Insufficient documentation

## 2014-01-09 DIAGNOSIS — Z792 Long term (current) use of antibiotics: Secondary | ICD-10-CM | POA: Insufficient documentation

## 2014-01-09 DIAGNOSIS — G8929 Other chronic pain: Secondary | ICD-10-CM | POA: Insufficient documentation

## 2014-01-09 DIAGNOSIS — S0990XA Unspecified injury of head, initial encounter: Secondary | ICD-10-CM | POA: Insufficient documentation

## 2014-01-09 DIAGNOSIS — Y9389 Activity, other specified: Secondary | ICD-10-CM | POA: Insufficient documentation

## 2014-01-09 DIAGNOSIS — M797 Fibromyalgia: Secondary | ICD-10-CM | POA: Insufficient documentation

## 2014-01-09 DIAGNOSIS — Z791 Long term (current) use of non-steroidal anti-inflammatories (NSAID): Secondary | ICD-10-CM | POA: Insufficient documentation

## 2014-01-09 DIAGNOSIS — M79605 Pain in left leg: Secondary | ICD-10-CM | POA: Insufficient documentation

## 2014-01-09 DIAGNOSIS — W01198A Fall on same level from slipping, tripping and stumbling with subsequent striking against other object, initial encounter: Secondary | ICD-10-CM | POA: Insufficient documentation

## 2014-01-09 DIAGNOSIS — Y998 Other external cause status: Secondary | ICD-10-CM | POA: Insufficient documentation

## 2014-01-09 DIAGNOSIS — S199XXA Unspecified injury of neck, initial encounter: Secondary | ICD-10-CM | POA: Insufficient documentation

## 2014-01-09 LAB — CBC WITH DIFFERENTIAL/PLATELET
Basophils Absolute: 0 10*3/uL (ref 0.0–0.1)
Basophils Relative: 0 % (ref 0–1)
Eosinophils Absolute: 0 10*3/uL (ref 0.0–0.7)
Eosinophils Relative: 0 % (ref 0–5)
HCT: 44.2 % (ref 36.0–46.0)
Hemoglobin: 15 g/dL (ref 12.0–15.0)
Lymphocytes Relative: 31 % (ref 12–46)
Lymphs Abs: 3 10*3/uL (ref 0.7–4.0)
MCH: 28.4 pg (ref 26.0–34.0)
MCHC: 33.9 g/dL (ref 30.0–36.0)
MCV: 83.6 fL (ref 78.0–100.0)
Monocytes Absolute: 0.6 10*3/uL (ref 0.1–1.0)
Monocytes Relative: 6 % (ref 3–12)
Neutro Abs: 6.1 10*3/uL (ref 1.7–7.7)
Neutrophils Relative %: 63 % (ref 43–77)
Platelets: 275 10*3/uL (ref 150–400)
RBC: 5.29 MIL/uL — ABNORMAL HIGH (ref 3.87–5.11)
RDW: 12.8 % (ref 11.5–15.5)
WBC: 9.7 10*3/uL (ref 4.0–10.5)

## 2014-01-09 LAB — BASIC METABOLIC PANEL
Anion gap: 15 (ref 5–15)
BUN: 11 mg/dL (ref 6–23)
CO2: 23 mEq/L (ref 19–32)
Calcium: 9.7 mg/dL (ref 8.4–10.5)
Chloride: 101 mEq/L (ref 96–112)
Creatinine, Ser: 0.64 mg/dL (ref 0.50–1.10)
GFR calc non Af Amer: >90 mL/min (ref >90)
Glucose, Bld: 163 mg/dL — ABNORMAL HIGH (ref 70–99)
POTASSIUM: 3.3 meq/L — AB (ref 3.7–5.3)
SODIUM: 139 meq/L (ref 137–147)

## 2014-01-09 LAB — TROPONIN I: Troponin I: <0.3 ng/mL (ref <0.30)

## 2014-01-09 NOTE — ED Notes (Signed)
EKG was performed at 19:39 and given to Dr. Blinda LeatherwoodPollina

## 2014-01-09 NOTE — ED Notes (Signed)
Pt refuses to wear c-collar, this RN explained the risks of taking the collar off prior to having CT scan.  Pt ripped off c-collar and states "I'm not wearing this, I want to leave".  RN explained to MD that pt was upset- pt signed AMA paperwork.  Ambulatory from exam room to lobby with steady gait- no distress noted.

## 2014-01-09 NOTE — ED Provider Notes (Signed)
CSN: [id number]     Arrival date & time 01/09/14  1927 History   First MD Initiated Contact with Patient 01/09/14 1934     Chief Complaint  Patient presents with  . Leg Pain     (Consider location/radiation/quality/duration/timing/severity/associated sxs/prior Treatment) HPI Comments: Patient presents to the ER for evaluation of bilateral leg pain. Patient reports a history of chronic leg pain secondary to fibromyalgia. Patient has reportedly been off of her medications after losing her insurance. She came to the ER for evaluation of this pain. She apparently had a syncopal episode while in the lobby. She fell and hit her head on the ground. Patient planning of headache and neck pain after the fall. No numbness, tingling or weakness in extremities.  Patient is a 33 y.o. female presenting with leg pain.  Leg Pain Associated symptoms: neck pain     Past Medical History  Diagnosis Date  . Anxiety   . Depression   . Arthritis   . Fibromyalgia    Past Surgical History  Procedure Laterality Date  . Cesarean section     Family History  Problem Relation Age of Onset  . COPD Mother   . Mental illness Mother   . Asthma Mother   . COPD Father   . Hypertension Father    History  Substance Use Topics  . Smoking status: Current Every Day Smoker -- 0.50 packs/day    Types: Cigarettes  . Smokeless tobacco: Not on file  . Alcohol Use: No   OB History    No data available     Review of Systems  Musculoskeletal: Positive for myalgias and neck pain.  Neurological: Positive for syncope and headaches.  All other systems reviewed and are negative.     Allergies  Latex and Penicillins  Home Medications   Prior to Admission medications   Medication Sig Start Date End Date Taking? Authorizing Provider  albuterol (PROVENTIL HFA;VENTOLIN HFA) 108 (90 BASE) MCG/ACT inhaler Inhale 2 puffs into the lungs every 6 (six) hours as needed for wheezing or shortness of breath.    Historical  Provider, MD  aspirin-acetaminophen-caffeine (EXCEDRIN MIGRAINE) [phone] MG per tablet Take 2 tablets by mouth daily.    Historical Provider, MD  clindamycin (CLEOCIN) 150 MG capsule Take 3 capsules (450 mg total) by mouth 3 (three) times daily. Patient not taking: Reported on 12/30/2013 12/14/13   Dahlia ClientHannah Muthersbaugh, PA-C  cyclobenzaprine (FLEXERIL) 10 MG tablet Take 1 tablet (10 mg total) by mouth 2 (two) times daily. Patient not taking: Reported on 12/30/2013 02/29/12   Erick ColaceAndrew E Kirsteins, MD  diclofenac sodium (VOLTAREN) 1 % GEL Apply 4 g topically 4 (four) times daily as needed (pain).    Historical Provider, MD  HYDROcodone-acetaminophen (NORCO) 10-325 MG per tablet Take 1-2 tablets by mouth every 6 (six) hours as needed for moderate pain.    Historical Provider, MD  LISINOPRIL PO Take 1 tablet by mouth daily.    Historical Provider, MD  meloxicam (MOBIC) 7.5 MG tablet Take 1 tablet (7.5 mg total) by mouth daily. Patient not taking: Reported on 12/30/2013 12/16/13   Junius FinnerErin O'Malley, PA-C   BP 124/88 mmHg  Temp(Src) 97.3 F (36.3 C) (Oral)  Resp 17  Ht 5\' 2"  (1.575 m)  Wt 210 lb (95.255 kg)  BMI 38.40 kg/m2  SpO2 100%  LMP 12/12/2013 Physical Exam  Constitutional: She is oriented to person, place, and time. She appears well-developed and well-nourished. No distress.  HENT:  Head: Normocephalic and atraumatic.  Right Ear: Hearing normal.  Left Ear: Hearing normal.  Nose: Nose normal.  Mouth/Throat: Oropharynx is clear and moist and mucous membranes are normal.  Eyes: Conjunctivae and EOM are normal. Pupils are equal, round, and reactive to light.  Neck: Normal range of motion. Neck supple. Muscular tenderness present.    Cardiovascular: Regular rhythm, S1 normal and S2 normal.  Exam reveals no gallop and no friction rub.   No murmur heard. Pulmonary/Chest: Effort normal and breath sounds normal. No respiratory distress. She exhibits no tenderness.  Abdominal: Soft. Normal  appearance and bowel sounds are normal. There is no hepatosplenomegaly. There is no tenderness. There is no rebound, no guarding, no tenderness at McBurney's point and negative Murphy's sign. No hernia.  Musculoskeletal: Normal range of motion.       Thoracic back: Normal. She exhibits no tenderness.       Lumbar back: Normal. She exhibits no tenderness.  Neurological: She is alert and oriented to person, place, and time. She has normal strength. No cranial nerve deficit or sensory deficit. Coordination normal. GCS eye subscore is 4. GCS verbal subscore is 5. GCS motor subscore is 6.  Skin: Skin is warm, dry and intact. No rash noted. No cyanosis.  Psychiatric: She has a normal mood and affect. Her speech is normal and behavior is normal. Thought content normal.  Nursing note and vitals reviewed.   ED Course  Procedures (including critical care time) Labs Review Labs Reviewed  CBC WITH DIFFERENTIAL  BASIC METABOLIC PANEL  TROPONIN I  I-STAT BETA HCG BLOOD, ED ([facility], [facility], [facility] ONLY)    Imaging Review No results found.   EKG Interpretation None      MDM   Final diagnoses:  Fall    Patient presented to the ER for evaluation of bilateral leg pain. Patient has been seen for this before. Reviewing her records reveals that she has presented to the ER multiple times in the last several weeks by and once for minor complaints. She reports a history of fibromyalgia and has been out of her medications. She has had multiple pain complaints in the ER recently asking for pain medication. I suspect some element of drug-seeking behavior.  Patient reportedly had a syncopal episode in the waiting room. I did not witness this, upon arrival in the ER she was complaining of head and neck pain. Examination revealed mild paraspinal tenderness without midline tenderness. She was complaining of headache, but was awake and alert. She wanted her cervical collar removed immediately. I declined to remove the  collar, explained to her that she could have a neck injury and required imaging to rule out intracranial injury as well as neck injury. At this point she became upset and left the emergency department AGAINST MEDICAL ADVICE.    Gilda Creasehristopher J. Jonel Weldon, MD 01/09/14 [phone]

## 2014-01-09 NOTE — ED Notes (Signed)
Pt to ED for evaluation of bilateral leg pain ongoing since coming off Narcotic pain medication for Fibromyalgia.  Staff reported pt had syncopal episode in lobby while waiting to check in.  Pt alert upon arrival to exam room- complaining of neck, back, and bilateral leg pain.  C-collar in place.

## 2014-01-10 LAB — I-STAT BETA HCG BLOOD, ED (MC, WL, AP ONLY)

## 2014-03-01 ENCOUNTER — Emergency Department (HOSPITAL_COMMUNITY): Payer: Self-pay

## 2014-03-01 ENCOUNTER — Emergency Department (HOSPITAL_COMMUNITY)
Admission: EM | Admit: 2014-03-01 | Discharge: 2014-03-01 | Discharge status: Against Medical Advice | Payer: Self-pay | Attending: Emergency Medicine | Admitting: Emergency Medicine

## 2014-03-01 ENCOUNTER — Encounter (HOSPITAL_COMMUNITY): Payer: Self-pay | Admitting: Emergency Medicine

## 2014-03-01 DIAGNOSIS — R0602 Shortness of breath: Secondary | ICD-10-CM | POA: Insufficient documentation

## 2014-03-01 DIAGNOSIS — Z720 Tobacco use: Secondary | ICD-10-CM | POA: Insufficient documentation

## 2014-03-01 DIAGNOSIS — R42 Dizziness and giddiness: Secondary | ICD-10-CM | POA: Insufficient documentation

## 2014-03-01 DIAGNOSIS — R079 Chest pain, unspecified: Secondary | ICD-10-CM | POA: Insufficient documentation

## 2014-03-01 DIAGNOSIS — I10 Essential (primary) hypertension: Secondary | ICD-10-CM | POA: Insufficient documentation

## 2014-03-01 DIAGNOSIS — R110 Nausea: Secondary | ICD-10-CM | POA: Insufficient documentation

## 2014-03-01 DIAGNOSIS — R0981 Nasal congestion: Secondary | ICD-10-CM | POA: Insufficient documentation

## 2014-03-01 HISTORY — DX: Essential (primary) hypertension: I10

## 2014-03-01 LAB — CBC
HCT: 44.2 % (ref 36.0–46.0)
HEMOGLOBIN: 15.1 g/dL — AB (ref 12.0–15.0)
MCH: 28.5 pg (ref 26.0–34.0)
MCHC: 34.2 g/dL (ref 30.0–36.0)
MCV: 83.6 fL (ref 78.0–100.0)
Platelets: 284 10*3/uL (ref 150–400)
RBC: 5.29 MIL/uL — AB (ref 3.87–5.11)
RDW: 12.9 % (ref 11.5–15.5)
WBC: 8.9 10*3/uL (ref 4.0–10.5)

## 2014-03-01 LAB — BASIC METABOLIC PANEL
Anion gap: 6 (ref 5–15)
BUN: 9 mg/dL (ref 6–23)
CHLORIDE: 107 meq/L (ref 96–112)
CO2: 23 mmol/L (ref 19–32)
Calcium: 9.1 mg/dL (ref 8.4–10.5)
Creatinine, Ser: 0.6 mg/dL (ref 0.50–1.10)
GFR calc Af Amer: >90 mL/min (ref >90)
GFR calc non Af Amer: >90 mL/min (ref >90)
GLUCOSE: 129 mg/dL — AB (ref 70–99)
Potassium: 3.7 mmol/L (ref 3.5–5.1)
Sodium: 136 mmol/L (ref 135–145)

## 2014-03-01 LAB — I-STAT TROPONIN, ED: Troponin i, poc: 0 ng/mL (ref 0.00–0.08)

## 2014-03-01 LAB — BRAIN NATRIURETIC PEPTIDE: B NATRIURETIC PEPTIDE 5: 24.5 pg/mL (ref 0.0–100.0)

## 2014-03-01 NOTE — ED Notes (Addendum)
Pt reports centralized chest pain since Monday along with sob, nausea and lightheadedness. No cough. States she has also been having "lung pain" for the past month. Pt having sinus congestion and HA. Speaking in complete sentences with no difficulty

## 2014-03-31 ENCOUNTER — Emergency Department (HOSPITAL_COMMUNITY)
Admission: EM | Admit: 2014-03-31 | Discharge: 2014-03-31 | Disposition: A | Discharge status: Home / Self Care | Payer: Self-pay | Attending: Emergency Medicine | Admitting: Emergency Medicine

## 2014-03-31 ENCOUNTER — Encounter (HOSPITAL_COMMUNITY): Payer: Self-pay | Admitting: *Deleted

## 2014-03-31 DIAGNOSIS — R0981 Nasal congestion: Secondary | ICD-10-CM

## 2014-03-31 DIAGNOSIS — Z91040 Latex allergy status: Secondary | ICD-10-CM | POA: Insufficient documentation

## 2014-03-31 DIAGNOSIS — M1990 Unspecified osteoarthritis, unspecified site: Secondary | ICD-10-CM | POA: Insufficient documentation

## 2014-03-31 DIAGNOSIS — M797 Fibromyalgia: Secondary | ICD-10-CM | POA: Insufficient documentation

## 2014-03-31 DIAGNOSIS — Z791 Long term (current) use of non-steroidal anti-inflammatories (NSAID): Secondary | ICD-10-CM | POA: Insufficient documentation

## 2014-03-31 DIAGNOSIS — I10 Essential (primary) hypertension: Secondary | ICD-10-CM | POA: Insufficient documentation

## 2014-03-31 DIAGNOSIS — J029 Acute pharyngitis, unspecified: Secondary | ICD-10-CM | POA: Insufficient documentation

## 2014-03-31 DIAGNOSIS — Z720 Tobacco use: Secondary | ICD-10-CM | POA: Insufficient documentation

## 2014-03-31 DIAGNOSIS — R519 Headache, unspecified: Secondary | ICD-10-CM

## 2014-03-31 DIAGNOSIS — Z79899 Other long term (current) drug therapy: Secondary | ICD-10-CM | POA: Insufficient documentation

## 2014-03-31 DIAGNOSIS — Z792 Long term (current) use of antibiotics: Secondary | ICD-10-CM | POA: Insufficient documentation

## 2014-03-31 DIAGNOSIS — Z880 Allergy status to penicillin: Secondary | ICD-10-CM | POA: Insufficient documentation

## 2014-03-31 DIAGNOSIS — R110 Nausea: Secondary | ICD-10-CM | POA: Insufficient documentation

## 2014-03-31 DIAGNOSIS — Z8659 Personal history of other mental and behavioral disorders: Secondary | ICD-10-CM | POA: Insufficient documentation

## 2014-03-31 DIAGNOSIS — R51 Headache: Secondary | ICD-10-CM

## 2014-03-31 LAB — RAPID STREP SCREEN (MED CTR MEBANE ONLY): Streptococcus, Group A Screen (Direct): NEGATIVE

## 2014-03-31 MED ORDER — KETOROLAC TROMETHAMINE 30 MG/ML IJ SOLN
30.0000 mg | Freq: Once | INTRAMUSCULAR | Status: AC
  Administered 2014-03-31: 30 mg via INTRAVENOUS
  Filled 2014-03-31: qty 1

## 2014-03-31 MED ORDER — LORAZEPAM 2 MG/ML IJ SOLN
1.0000 mg | Freq: Once | INTRAMUSCULAR | Status: AC
  Administered 2014-03-31: 1 mg via INTRAVENOUS
  Filled 2014-03-31: qty 1

## 2014-03-31 MED ORDER — SODIUM CHLORIDE 0.9 % IV BOLUS (SEPSIS)
1000.0000 mL | Freq: Once | INTRAVENOUS | Status: AC
  Administered 2014-03-31: 1000 mL via INTRAVENOUS

## 2014-03-31 MED ORDER — SALINE SPRAY 0.65 % NA SOLN: 1.0000 | NASAL | Status: DC | PRN

## 2014-03-31 MED ORDER — METOCLOPRAMIDE HCL 5 MG/ML IJ SOLN
10.0000 mg | Freq: Once | INTRAMUSCULAR | Status: AC
  Administered 2014-03-31: 10 mg via INTRAVENOUS
  Filled 2014-03-31: qty 2

## 2014-03-31 MED ORDER — DIPHENHYDRAMINE HCL 50 MG/ML IJ SOLN
25.0000 mg | Freq: Once | INTRAMUSCULAR | Status: AC
  Administered 2014-03-31: 25 mg via INTRAVENOUS
  Filled 2014-03-31: qty 1

## 2014-03-31 NOTE — ED Notes (Signed)
Pt c/o sore throat, HA, N/V, congestion, ear pain.

## 2014-03-31 NOTE — ED Provider Notes (Signed)
CSN: [id number]     Arrival date & time 03/31/14  1701 History   First MD Initiated Contact with Patient 03/31/14 1806     Chief Complaint  Patient presents with  . Headache     (Consider location/radiation/quality/duration/timing/severity/associated sxs/prior Treatment) HPI Comments: Patient presents to the emergency department with chief complaint of headache, sore throat, nausea, sinus congestion, chest congestion, and ear pain. Patient states the symptoms started a couple weeks ago. She has tried taking Advil with no relief. She denies any fevers, chills, productive cough, vomiting, or diarrhea. Denies any chest pain or abdominal pain. She states that most of her symptoms are coming from the congestion in her head, which she believes causing her to have a migraine headache. She denies any numbness or weakness in her extremities. The symptoms are aggravated with light and noise.  The history is provided by the patient. No language interpreter was used.    Past Medical History  Diagnosis Date  . Anxiety   . Depression   . Arthritis   . Fibromyalgia   . Hypertension    Past Surgical History  Procedure Laterality Date  . Cesarean section     Family History  Problem Relation Age of Onset  . COPD Mother   . Mental illness Mother   . Asthma Mother   . COPD Father   . Hypertension Father    History  Substance Use Topics  . Smoking status: Current Every Day Smoker -- 0.50 packs/day    Types: Cigarettes  . Smokeless tobacco: Not on file  . Alcohol Use: No   OB History    No data available     Review of Systems  Constitutional: Positive for chills. Negative for fever.  HENT: Positive for postnasal drip, rhinorrhea, sinus pressure, sneezing and sore throat.   Respiratory: Negative for cough and shortness of breath.   Cardiovascular: Negative for chest pain.  Gastrointestinal: Positive for nausea. Negative for vomiting, abdominal pain, diarrhea and constipation.   Genitourinary: Negative for dysuria.  All other systems reviewed and are negative.     Allergies  Latex and Penicillins  Home Medications   Prior to Admission medications   Medication Sig Start Date End Date Taking? Authorizing Provider  albuterol (PROVENTIL HFA;VENTOLIN HFA) 108 (90 BASE) MCG/ACT inhaler Inhale 2 puffs into the lungs every 6 (six) hours as needed for wheezing or shortness of breath.   Yes Historical Provider, MD  aspirin-acetaminophen-caffeine (EXCEDRIN MIGRAINE) [phone] MG per tablet Take 2 tablets by mouth daily.   Yes Historical Provider, MD  clindamycin (CLEOCIN) 150 MG capsule Take 3 capsules (450 mg total) by mouth 3 (three) times daily. Patient not taking: Reported on 12/30/2013 12/14/13   Dahlia Client Muthersbaugh, PA-C  cyclobenzaprine (FLEXERIL) 10 MG tablet Take 1 tablet (10 mg total) by mouth 2 (two) times daily. Patient not taking: Reported on 12/30/2013 02/29/12   Erick Colace, MD  meloxicam (MOBIC) 7.5 MG tablet Take 1 tablet (7.5 mg total) by mouth daily. Patient not taking: Reported on 12/30/2013 12/16/13   Junius Finner, PA-C   BP 111/75 mmHg  Pulse 94  Temp(Src) 97.9 F (36.6 C) (Oral)  Resp 18  Ht  (1.575 m)  Wt 215 lb (97.523 kg)  BMI 39.31 kg/m2  SpO2 99% Physical Exam  Constitutional: She is oriented to person, place, and time. She appears well-developed and well-nourished. No distress.  HENT:  Head: Normocephalic and atraumatic.  Right Ear: External ear normal.  Left Ear: External ear normal.  Mildly erythematous, no tonsillar exudate, no abscess, no stridor, uvula is midline  TMs appear clear, but partially of security secondary to cerumen bilaterally  Eyes: Conjunctivae and EOM are normal. Pupils are equal, round, and reactive to light.  Neck: Normal range of motion. Neck supple.  No pain with neck flexion, no meningismus  Cardiovascular: Normal rate, regular rhythm and normal heart sounds.  Exam reveals no gallop and no  friction rub.   No murmur heard. Pulmonary/Chest: Effort normal and breath sounds normal. No stridor. No respiratory distress. She has no wheezes. She has no rales. She exhibits no tenderness.  CTAB  Abdominal: Soft. Bowel sounds are normal. She exhibits no distension and no mass. There is no tenderness. There is no rebound and no guarding.  Musculoskeletal: Normal range of motion. She exhibits no edema or tenderness.  Normal gait.  Neurological: She is alert and oriented to person, place, and time. She has normal reflexes.  CN 3-12 intact, normal finger to nose, no pronator drift, sensation and strength intact bilaterally.  Skin: Skin is warm and dry. No rash noted. She is not diaphoretic.  Psychiatric: She has a normal mood and affect. Her behavior is normal. Judgment and thought content normal.  Nursing note and vitals reviewed.   ED Course  Procedures (including critical care time) Results for orders placed or performed during the hospital encounter of 03/31/14  Rapid strep screen  Result Value Ref Range   Streptococcus, Group A Screen (Direct) NEGATIVE NEGATIVE   No results found.    EKG Interpretation None      MDM   Final diagnoses:  Headache, unspecified headache type  Sinus congestion  Sore throat    Patient with headache. Possibly precipitated by sinus congestion. Will give headache cocktail. Will reassess. Strep test negative. Vital signs are stable. Lungs are clear. Doubt pneumonia.  Patient complains of some anxiety, will give ativan.  Pt HA treated and improved while in ED.  Presentation is like pts typical HA and non concerning [facility], ICH, Meningitis, or temporal arteritis. Pt is afebrile with no focal neuro deficits, nuchal rigidity, or change in vision. Pt is to follow up with PCP to discuss prophylactic medication. Pt verbalizes understanding and is agreeable with plan to dc.    Roxy Horsemanobert Deaun Rocha, PA-C 03/31/14 2322  Elwin MochaBlair Walden, MD 03/31/14 [phone]

## 2014-03-31 NOTE — ED Notes (Signed)
The pt is c/o a headache with a sore throat and ear pain for 2 weeks.  n v lmp not reg abalation

## 2014-03-31 NOTE — Discharge Instructions (Signed)
Upper Respiratory Infection, Adult °An upper respiratory infection (URI) is also sometimes known as the common cold. The upper respiratory tract includes the nose, sinuses, throat, trachea, and bronchi. Bronchi are the airways leading to the lungs. Most people improve within 1 week, but symptoms can last up to 2 weeks. A residual cough may last even longer.  °CAUSES °Many different viruses can infect the tissues lining the upper respiratory tract. The tissues become irritated and inflamed and often become very moist. Mucus production is also common. A cold is contagious. You can easily spread the virus to others by oral contact. This includes kissing, sharing a glass, coughing, or sneezing. Touching your mouth or nose and then touching a surface, which is then touched by another person, can also spread the virus. °SYMPTOMS  °Symptoms typically develop 1 to 3 days after you come in contact with a cold virus. Symptoms vary from person to person. They may include: °· Runny nose. °· Sneezing. °· Nasal congestion. °· Sinus irritation. °· Sore throat. °· Loss of voice (laryngitis). °· Cough. °· Fatigue. °· Muscle aches. °· Loss of appetite. °· Headache. °· Low-grade fever. °DIAGNOSIS  °You might diagnose your own cold based on familiar symptoms, since most people get a cold 2 to 3 times a year. Your caregiver can confirm this based on your exam. Most importantly, your caregiver can check that your symptoms are not due to another disease such as strep throat, sinusitis, pneumonia, asthma, or epiglottitis. Blood tests, throat tests, and X-rays are not necessary to diagnose a common cold, but they may sometimes be helpful in excluding other more serious diseases. Your caregiver will decide if any further tests are required. °RISKS AND COMPLICATIONS  °You may be at risk for a more severe case of the common cold if you smoke cigarettes, have chronic heart disease (such as heart failure) or lung disease (such as asthma), or if  you have a weakened immune system. The very young and very old are also at risk for more serious infections. Bacterial sinusitis, middle ear infections, and bacterial pneumonia can complicate the common cold. The common cold can worsen asthma and chronic obstructive pulmonary disease (COPD). Sometimes, these complications can require emergency medical care and may be life-threatening. °PREVENTION  °The best way to protect against getting a cold is to practice good hygiene. Avoid oral or hand contact with people with cold symptoms. Wash your hands often if contact occurs. There is no clear evidence that vitamin C, vitamin E, echinacea, or exercise reduces the chance of developing a cold. However, it is always recommended to get plenty of rest and practice good nutrition. °TREATMENT  °Treatment is directed at relieving symptoms. There is no cure. Antibiotics are not effective, because the infection is caused by a virus, not by bacteria. Treatment may include: °· Increased fluid intake. Sports drinks offer valuable electrolytes, sugars, and fluids. °· Breathing heated mist or steam (vaporizer or shower). °· Eating chicken soup or other clear broths, and maintaining good nutrition. °· Getting plenty of rest. °· Using gargles or lozenges for comfort. °· Controlling fevers with ibuprofen or acetaminophen as directed by your caregiver. °· Increasing usage of your inhaler if you have asthma. °Zinc gel and zinc lozenges, taken in the first 24 hours of the common cold, can shorten the duration and lessen the severity of symptoms. Pain medicines may help with fever, muscle aches, and throat pain. A variety of non-prescription medicines are available to treat congestion and runny nose. Your caregiver   caregiver can make recommendations and may suggest nasal or lung inhalers for other symptoms.  HOME CARE INSTRUCTIONS   Only take over-the-counter or prescription medicines for pain, discomfort, or fever as directed by your  caregiver.  Use a warm mist humidifier or inhale steam from a shower to increase air moisture. This may keep secretions moist and make it easier to breathe.  Drink enough water and fluids to keep your urine clear or pale yellow.  Rest as needed.  Return to work when your temperature has returned to normal or as your caregiver advises. You may need to stay home longer to avoid infecting others. You can also use a face mask and careful hand washing to prevent spread of the virus. SEEK MEDICAL CARE IF:   After the first few days, you feel you are getting worse rather than better.  You need your caregiver's advice about medicines to control symptoms.  You develop chills, worsening shortness of breath, or brown or red sputum. These may be signs of pneumonia.  You develop yellow or brown nasal discharge or pain in the face, especially when you bend forward. These may be signs of sinusitis.  You develop a fever, swollen neck glands, pain with swallowing, or white areas in the back of your throat. These may be signs of strep throat. SEEK IMMEDIATE MEDICAL CARE IF:   You have a fever.  You develop severe or persistent headache, ear pain, sinus pain, or chest pain.  You develop wheezing, a prolonged cough, cough up blood, or have a change in your usual mucus (if you have chronic lung disease).  You develop sore muscles or a stiff neck. Document Released: 08/05/2000 Document Revised: 05/04/2011 Document Reviewed: 05/17/2013 [facility] Patient Information 2015 [place], [place]. This information is not intended to replace advice given to you by your health care provider. Make sure you discuss any questions you have with your health care provider.  General Headache Without Cause A headache is pain or discomfort felt around the head or neck area. The specific cause of a headache may not be found. There are many causes and types of headaches. A few common ones are:  Tension headaches.  Migraine  headaches.  Cluster headaches.  Chronic daily headaches. HOME CARE INSTRUCTIONS   Keep all follow-up appointments with your caregiver or any specialist referral.  Only take over-the-counter or prescription medicines for pain or discomfort as directed by your caregiver.  Lie down in a dark, quiet room when you have a headache.  Keep a headache journal to find out what may trigger your migraine headaches. For example, write down:  What you eat and drink.  How much sleep you get.  Any change to your diet or medicines.  Try massage or other relaxation techniques.  Put ice packs or heat on the head and neck. Use these 3 to 4 times per day for 15 to 20 minutes each time, or as needed.  Limit stress.  Sit up straight, and do not tense your muscles.  Quit smoking if you smoke.  Limit alcohol use.  Decrease the amount of caffeine you drink, or stop drinking caffeine.  Eat and sleep on a regular schedule.  Get 7 to 9 hours of sleep, or as recommended by your caregiver.  Keep lights dim if bright lights bother you and make your headaches worse. SEEK MEDICAL CARE IF:   You have problems with the medicines you were prescribed.  Your medicines are not working.  You have a change  from the usual headache.  You have nausea or vomiting. SEEK IMMEDIATE MEDICAL CARE IF:   Your headache becomes severe.  You have a fever.  You have a stiff neck.  You have loss of vision.  You have muscular weakness or loss of muscle control.  You start losing your balance or have trouble walking.  You feel faint or pass out.  You have severe symptoms that are different from your first symptoms. MAKE SURE YOU:   Understand these instructions.  Will watch your condition.  Will get help right away if you are not doing well or get worse. Document Released: 02/09/2005 Document Revised: 05/04/2011 Document Reviewed: 02/25/2011 [facility] Patient Information 2015 [place]. This  information is not intended to replace advice given to you by your health care provider. Make sure you discuss any questions you have with your health care provider.

## 2014-04-03 LAB — CULTURE, GROUP A STREP

## 2014-04-12 ENCOUNTER — Encounter: Payer: Self-pay | Admitting: Internal Medicine

## 2014-04-12 ENCOUNTER — Ambulatory Visit: Payer: Self-pay | Attending: Internal Medicine | Admitting: Internal Medicine

## 2014-04-12 VITALS — BP 125/87 | HR 100 | Temp 98.5°F | Resp 16 | Wt 237.6 lb

## 2014-04-12 DIAGNOSIS — R51 Headache: Secondary | ICD-10-CM | POA: Insufficient documentation

## 2014-04-12 DIAGNOSIS — Z7982 Long term (current) use of aspirin: Secondary | ICD-10-CM | POA: Insufficient documentation

## 2014-04-12 DIAGNOSIS — Z87891 Personal history of nicotine dependence: Secondary | ICD-10-CM | POA: Insufficient documentation

## 2014-04-12 DIAGNOSIS — M797 Fibromyalgia: Secondary | ICD-10-CM | POA: Insufficient documentation

## 2014-04-12 DIAGNOSIS — F32A Depression, unspecified: Secondary | ICD-10-CM | POA: Insufficient documentation

## 2014-04-12 DIAGNOSIS — F329 Major depressive disorder, single episode, unspecified: Secondary | ICD-10-CM | POA: Insufficient documentation

## 2014-04-12 DIAGNOSIS — Z139 Encounter for screening, unspecified: Secondary | ICD-10-CM

## 2014-04-12 DIAGNOSIS — R519 Headache, unspecified: Secondary | ICD-10-CM

## 2014-04-12 LAB — CBC WITH DIFFERENTIAL/PLATELET
BASOS ABS: 0.1 10*3/uL (ref 0.0–0.1)
Basophils Relative: 1 % (ref 0–1)
EOS PCT: 0 % (ref 0–5)
Eosinophils Absolute: 0 10*3/uL (ref 0.0–0.7)
HCT: 45.5 % (ref 36.0–46.0)
HEMOGLOBIN: 15.6 g/dL — AB (ref 12.0–15.0)
LYMPHS ABS: 2.8 10*3/uL (ref 0.7–4.0)
LYMPHS PCT: 28 % (ref 12–46)
MCH: 28.7 pg (ref 26.0–34.0)
MCHC: 34.3 g/dL (ref 30.0–36.0)
MCV: 83.8 fL (ref 78.0–100.0)
MPV: 10.5 fL (ref 8.6–12.4)
Monocytes Absolute: 0.6 10*3/uL (ref 0.1–1.0)
Monocytes Relative: 6 % (ref 3–12)
NEUTROS ABS: 6.5 10*3/uL (ref 1.7–7.7)
NEUTROS PCT: 65 % (ref 43–77)
Platelets: 310 10*3/uL (ref 150–400)
RBC: 5.43 MIL/uL — ABNORMAL HIGH (ref 3.87–5.11)
RDW: 13.6 % (ref 11.5–15.5)
WBC: 10 10*3/uL (ref 4.0–10.5)

## 2014-04-12 MED ORDER — AMITRIPTYLINE HCL 150 MG PO TABS: 150.0000 mg | ORAL_TABLET | Freq: Every day | ORAL | Status: DC

## 2014-04-12 MED ORDER — GABAPENTIN 300 MG PO CAPS: 300.0000 mg | ORAL_CAPSULE | Freq: Three times a day (TID) | ORAL | Status: DC

## 2014-04-12 NOTE — Progress Notes (Signed)
Patient Demographics  Monica Vance, is a 34 y.o. female  GNF:[id number]  QIO:[id number]  DOB - 1981/01/15  CC:  Chief Complaint  Patient presents with  . Establish Care       HPI: Monica Vance is a 34 y.o. female here today to establish medical care.patient has history of chronic headaches/migraine headaches, depression/anxiety, fibromyalgia, as per patient she also had the problem with the blood pressure and used to take some medication currently her blood pressure is 125/87, she does complaints of ongoing headaches and has been under a lot of stress has family issues, as per patient she is to see a psychiatrist and the neurologist in the past she has tried several medications, she denies any SI or HI, she is in the process to get some insurance. Patient has  No chest pain, No abdominal pain - No Nausea, No new weakness tingling or numbness, No Cough - SOB.  Allergies  Allergen Reactions  . Latex Itching and Rash  . Penicillins Other (See Comments)    As child - told by mother- unknown reaction.   Past Medical History  Diagnosis Date  . Anxiety   . Depression   . Arthritis   . Fibromyalgia   . Hypertension    Current Outpatient Prescriptions on File Prior to Visit  Medication Sig Dispense Refill  . albuterol (PROVENTIL HFA;VENTOLIN HFA) 108 (90 BASE) MCG/ACT inhaler Inhale 2 puffs into the lungs every 6 (six) hours as needed for wheezing or shortness of breath.    Marland Kitchen. aspirin-acetaminophen-caffeine (EXCEDRIN MIGRAINE) 250-250-65 MG per tablet Take 2 tablets by mouth daily.    . clindamycin (CLEOCIN) 150 MG capsule Take 3 capsules (450 mg total) by mouth 3 (three) times daily. (Patient not taking: Reported on 12/30/2013) 90 capsule 0  . cyclobenzaprine (FLEXERIL) 10 MG tablet Take 1 tablet (10 mg total) by mouth 2 (two) times daily. (Patient not taking: Reported on 12/30/2013) 30 tablet 0  . meloxicam (MOBIC) 7.5 MG tablet Take 1 tablet (7.5 mg total) by mouth daily.  (Patient not taking: Reported on 12/30/2013) 30 tablet 0  . sodium chloride (OCEAN) 0.65 % SOLN nasal spray Place 1 spray into both nostrils as needed for congestion. 1 Bottle 0   No current facility-administered medications on file prior to visit.   Family History  Problem Relation Age of Onset  . COPD Mother   . Mental illness Mother   . Asthma Mother   . COPD Father   . Hypertension Father   . Cancer Paternal Uncle     lung cancer    History   Social History  . Marital Status: Divorced    Spouse Name: N/A  . Number of Children: N/A  . Years of Education: N/A   Occupational History  . Not on file.   Social History Main Topics  . Smoking status: Former Smoker -- 0.50 packs/day for 20 years    Types: Cigarettes  . Smokeless tobacco: Not on file     Comment: electronic cigarette  . Alcohol Use: No  . Drug Use: No  . Sexual Activity: Not on file   Other Topics Concern  . Not on file   Social History Narrative    Review of Systems: Constitutional: Negative for fever, chills, diaphoresis, activity change, appetite change and fatigue. HENT: Negative for ear pain, nosebleeds, congestion, facial swelling, rhinorrhea, neck pain, neck stiffness and ear discharge.  Eyes: Negative for pain, discharge, redness, itching and visual disturbance. Respiratory: Negative for  cough, choking, chest tightness, shortness of breath, wheezing and stridor.  Cardiovascular: Negative for chest pain, palpitations and leg swelling. Gastrointestinal: Negative for abdominal distention. Genitourinary: Negative for dysuria, urgency, frequency, hematuria, flank pain, decreased urine volume, difficulty urinating and dyspareunia.  Musculoskeletal: Negative for back pain, joint swelling, arthralgia and gait problem. Neurological: Negative for dizziness, tremors, seizures, syncope, facial asymmetry, speech difficulty, weakness, light-headedness, numbness and headaches.  Hematological: Negative for  adenopathy. Does not bruise/bleed easily. Psychiatric/Behavioral: Negative for hallucinations, behavioral problems, confusion, dysphoric mood, decreased concentration and agitation.    Objective:   Filed Vitals:   04/12/14 1602  BP: 125/87  Pulse: 100  Temp: 98.5 F (36.9 C)  Resp: 16    Physical Exam: Constitutional: Patient appears well-developed and well-nourished. No distress. HENT: Normocephalic, atraumatic, External right and left ear normal. Oropharynx is clear and moist.  Eyes: Conjunctivae and EOM are normal. PERRLA, no scleral icterus. Neck: Normal ROM. Neck supple. No JVD. No tracheal deviation. No thyromegaly. CVS: RRR, S1/S2 +, no murmurs, no gallops, no carotid bruit.  Pulmonary: Effort and breath sounds normal, no stridor, rhonchi, wheezes, rales.  Abdominal: Soft. BS +, no distension, tenderness, rebound or guarding.  Musculoskeletal: Normal range of motion. No edema and no tenderness.  Neuro: Alert. Normal reflexes, muscle tone coordination. No cranial nerve deficit. Skin: Skin is warm and dry. No rash noted. Not diaphoretic. No erythema. No pallor. Psychiatric: Normal mood and affect. Behavior, judgment, thought content normal.  Lab Results  Component Value Date   WBC 8.9 03/01/2014   HGB 15.1* 03/01/2014   HCT 44.2 03/01/2014   MCV 83.6 03/01/2014   PLT 284 03/01/2014   Lab Results  Component Value Date   CREATININE 0.60 03/01/2014   BUN 9 03/01/2014   NA 136 03/01/2014   K 3.7 03/01/2014   CL 107 03/01/2014   CO2 23 03/01/2014    No results found for: HGBA1C Lipid Panel  No results found for: CHOL, TRIG, HDL, CHOLHDL, VLDL, LDLCALC     Assessment and plan:   1. Nonintractable headache, unspecified chronicity pattern, unspecified headache type  - amitriptyline (ELAVIL) 150 MG tablet; Take 1 tablet (150 mg total) by mouth at bedtime.  Dispense: 30 tablet; Refill: 3  2. Fibromyalgia  - gabapentin (NEURONTIN) 300 MG capsule; Take 1 capsule  (300 mg total) by mouth 3 (three) times daily.  Dispense: 90 capsule; Refill: 3  3. Depression  - TSH - amitriptyline (ELAVIL) 150 MG tablet; Take 1 tablet (150 mg total) by mouth at bedtime.  Dispense: 30 tablet; Refill: 3  4. Screening Ordered baseline blood work.  - COMPLETE METABOLIC PANEL WITH GFR - CBC with Differential/Platelet - Hemoglobin A1c - Vit D  25 hydroxy (rtn osteoporosis monitoring) - TSH      Health Maintenance  -Pap Smear  Will be scheduled  -Vaccinations:  Patient declines flu shot   Return in about 3 months (around 07/11/2014), or if symptoms worsen or fail to improve, for Schedule Appt with Dr Glendora Score for PAP.    The patient was given clear instructions to go to ER or return to medical center if symptoms don't improve, worsen or new problems develop. The patient verbalized understanding. The patient was told to call to get lab results if they haven't heard anything in the next week.    This note has been created with Education officer, environmental. Any transcriptional errors are unintentional.   Doris Cheadle, MD

## 2014-04-12 NOTE — Progress Notes (Signed)
Patient here to establish care Currently not taking any medications due to her loosing her insurance Patient has a history of HTN, fibra myalgia, migraines and DDD Patient states has had several falls due to the weakness in her legs from fibra myalgia

## 2014-04-13 ENCOUNTER — Ambulatory Visit: Payer: Self-pay | Admitting: Internal Medicine

## 2014-04-13 LAB — COMPLETE METABOLIC PANEL WITH GFR
ALBUMIN: 4.2 g/dL (ref 3.5–5.2)
ALT: 19 U/L (ref 0–35)
AST: 18 U/L (ref 0–37)
Alkaline Phosphatase: 70 U/L (ref 39–117)
BUN: 9 mg/dL (ref 6–23)
CALCIUM: 9.8 mg/dL (ref 8.4–10.5)
CO2: 23 meq/L (ref 19–32)
CREATININE: 0.67 mg/dL (ref 0.50–1.10)
Chloride: 106 mEq/L (ref 96–112)
GFR, Est Non African American: >89 mL/min
Glucose, Bld: 84 mg/dL (ref 70–99)
Potassium: 4.5 mEq/L (ref 3.5–5.3)
SODIUM: 145 meq/L (ref 135–145)
TOTAL PROTEIN: 6.6 g/dL (ref 6.0–8.3)
Total Bilirubin: 0.5 mg/dL (ref 0.2–1.2)

## 2014-04-13 LAB — HEMOGLOBIN A1C
HEMOGLOBIN A1C: 5.7 % — AB (ref <5.7)
Mean Plasma Glucose: 117 mg/dL — ABNORMAL HIGH (ref <117)

## 2014-04-13 LAB — TSH: TSH: 0.66 u[IU]/mL (ref 0.350–4.500)

## 2014-04-13 LAB — VITAMIN D 25 HYDROXY (VIT D DEFICIENCY, FRACTURES): Vit D, 25-Hydroxy: 9 ng/mL — ABNORMAL LOW (ref 30–100)

## 2014-04-19 ENCOUNTER — Telehealth: Payer: Self-pay

## 2014-04-19 MED ORDER — VITAMIN D (ERGOCALCIFEROL) 1.25 MG (50000 UNIT) PO CAPS: 50000.0000 [IU] | ORAL_CAPSULE | ORAL | Status: DC

## 2014-04-19 NOTE — Telephone Encounter (Signed)
Patient is aware of her lab results [facility]  

## 2014-04-19 NOTE — Telephone Encounter (Signed)
-----   Message from Doris Cheadleeepak Advani, MD sent at 04/13/2014 12:26 PM EST ----- Blood work reviewed, noticed low vitamin D, call patient advise to start ergocalciferol 50,000 units once a week for the duration of  12 weeks. Also her sugar level is borderline elevated, her hemoglobin A1c is 5.7%, advised patient for low carbohydrate diet.

## 2014-09-12 ENCOUNTER — Emergency Department (INDEPENDENT_AMBULATORY_CARE_PROVIDER_SITE_OTHER)
Admission: EM | Admit: 2014-09-12 | Discharge: 2014-09-12 | Disposition: A | Discharge status: Home / Self Care | Payer: Medicaid Other | Source: Home / Self Care

## 2014-09-12 ENCOUNTER — Encounter (HOSPITAL_COMMUNITY): Payer: Self-pay | Admitting: *Deleted

## 2014-09-12 DIAGNOSIS — J020 Streptococcal pharyngitis: Secondary | ICD-10-CM

## 2014-09-12 LAB — POCT RAPID STREP A: Streptococcus, Group A Screen (Direct): POSITIVE — AB

## 2014-09-12 MED ORDER — CEFDINIR 300 MG PO CAPS
600.0000 mg | ORAL_CAPSULE | Freq: Every day | ORAL | Status: DC
Start: 2014-09-12 — End: 2014-10-16

## 2014-09-12 MED ORDER — HYDROCODONE-ACETAMINOPHEN 5-325 MG PO TABS: 1.0000 | ORAL_TABLET | Freq: Four times a day (QID) | ORAL | Status: DC | PRN

## 2014-09-12 MED ORDER — CEFDINIR 300 MG PO CAPS: 600.0000 mg | ORAL_CAPSULE | Freq: Every day | ORAL | Status: DC

## 2014-09-12 MED ORDER — CEFTRIAXONE SODIUM 1 G IJ SOLR
1.0000 g | Freq: Once | INTRAMUSCULAR | Status: AC
  Administered 2014-09-12: 1 g via INTRAMUSCULAR

## 2014-09-12 MED ORDER — CEFTRIAXONE SODIUM 1 G IJ SOLR
INTRAMUSCULAR | Status: AC
  Filled 2014-09-12: qty 10

## 2014-09-12 NOTE — ED Provider Notes (Addendum)
CSN: [id number]     Arrival date & time 09/12/14  1443 History   First MD Initiated Contact with Patient 09/12/14 1551     Chief Complaint  Patient presents with  . Sore Throat    The history is provided by the patient.   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 She female yesterday with a fever and subsequently had vomiting 3 times this morning and early afternoon. She's had no diarrhea.  Her main complaint is her throat. This is been quite raw and it's difficult to talk or swallow. She has no cough. She's had no diarrhea. She's had no rash  Past Medical History  Diagnosis Date  . Anxiety   . Depression   . Arthritis   . Fibromyalgia   . Hypertension    Past Surgical History  Procedure Laterality Date  . Cesarean section     Family History  Problem Relation Age of Onset  . COPD Mother   . Mental illness Mother   . Asthma Mother   . COPD Father   . Hypertension Father   . Cancer Paternal Uncle     lung cancer    History  Substance Use Topics  . Smoking status: Former Smoker -- 0.50 packs/day for 20 years    Types: Cigarettes  . Smokeless tobacco: Not on file     Comment: electronic cigarette  . Alcohol Use: No   OB History    No data available     Review of Systems  Constitutional: Positive for fever, chills and fatigue.  HENT: Positive for postnasal drip, rhinorrhea, sore throat and trouble swallowing. Negative for congestion, dental problem, drooling, ear pain, facial swelling, mouth sores and sinus pressure.   Eyes: Negative.   Respiratory: Negative.   Cardiovascular: Negative.   Gastrointestinal: Positive for vomiting. Negative for abdominal pain.  Genitourinary: Negative.   Musculoskeletal: Positive for myalgias.  Skin: Negative.   Neurological: Negative.     Allergies  Latex and Penicillins  Home Medications   Prior to Admission medications   Medication Sig Start Date End Date Taking? Authorizing Provider  albuterol  (PROVENTIL HFA;VENTOLIN HFA) 108 (90 BASE) MCG/ACT inhaler Inhale 2 puffs into the lungs every 6 (six) hours as needed for wheezing or shortness of breath.    Historical Provider, MD  amitriptyline (ELAVIL) 150 MG tablet Take 1 tablet (150 mg total) by mouth at bedtime. 04/12/14   Doris Cheadleeepak Advani, MD  aspirin-acetaminophen-caffeine (EXCEDRIN MIGRAINE) [id number] MG per tablet Take 2 tablets by mouth daily.    Historical Provider, MD  cefdinir (OMNICEF) 300 MG capsule Take 2 capsules (600 mg total) by mouth daily. 09/12/14   Elvina SidleKurt Jari Carollo, MD  cefdinir (OMNICEF) 300 MG capsule Take 2 capsules (600 mg total) by mouth daily. 09/12/14   Elvina SidleKurt Cleve Paolillo, MD  clindamycin (CLEOCIN) 150 MG capsule Take 3 capsules (450 mg total) by mouth 3 (three) times daily. Patient not taking: Reported on 12/30/2013 12/14/13   Dahlia ClientHannah Muthersbaugh, PA-C  cyclobenzaprine (FLEXERIL) 10 MG tablet Take 1 tablet (10 mg total) by mouth 2 (two) times daily. Patient not taking: Reported on 12/30/2013 02/29/12   Erick ColaceAndrew E Kirsteins, MD  gabapentin (NEURONTIN) 300 MG capsule Take 1 capsule (300 mg total) by mouth 3 (three) times daily. 04/12/14   Doris Cheadleeepak Advani, MD  HYDROcodone-acetaminophen (NORCO) 5-325 MG per tablet Take 1 tablet by mouth every 6 (six) hours as needed. 09/12/14   Elvina SidleKurt Jadzia Ibsen, MD  meloxicam (MOBIC) 7.5  MG tablet Take 1 tablet (7.5 mg total) by mouth daily. Patient not taking: Reported on 12/30/2013 12/16/13   Junius Finner, PA-C  sodium chloride (OCEAN) 0.65 % SOLN nasal spray Place 1 spray into both nostrils as needed for congestion. 03/31/14   Roxy Horseman, PA-C  Vitamin D, Ergocalciferol, (DRISDOL) 50000 UNITS CAPS capsule Take 1 capsule (50,000 Units total) by mouth every 7 (seven) days. 04/19/14   Doris Cheadle, MD   BP 126/83 mmHg  Pulse 114  Temp(Src) 101.1 F (38.4 C) (Oral)  Resp 16  SpO2 99% Physical Exam  Constitutional: She is oriented to person, place, and time. She appears well-developed and  well-nourished.  HENT:  Head: Normocephalic and atraumatic.  Right Ear: External ear normal.  Left Ear: External ear normal.  Mouth/Throat: Oropharyngeal exudate present.  Throat is diffusely erythematous. Airway is preserved  Neck: Normal range of motion. Neck supple. No tracheal deviation present. No thyromegaly present.  Cardiovascular: Normal rate, regular rhythm and normal heart sounds.   Pulmonary/Chest: Effort normal and breath sounds normal. No stridor.  Abdominal: Soft. She exhibits no distension. There is no tenderness.  Musculoskeletal: Normal range of motion.  Lymphadenopathy:    She has cervical adenopathy.  Neurological: She is alert and oriented to person, place, and time.  Skin: Skin is warm and dry.  Nursing note and vitals reviewed.   ED Course  Procedures (including critical care time) Labs Review Labs Reviewed  POCT RAPID STREP A - Abnormal; Notable for the following:    Streptococcus, Group A Screen (Direct) POSITIVE (*)    All other components within normal limits     MDM       ICD-9-CM ICD-10-CM   1. Strep throat 034.0 J02.0 cefdinir (OMNICEF) 300 MG capsule     cefdinir (OMNICEF) 300 MG capsule     HYDROcodone-acetaminophen (NORCO) 5-325 MG per tablet   Given Rocephin 1 gm IM here.  Signed, Elvina Sidle, MD   Elvina Sidle, MD 09/12/14 1610  Elvina Sidle, MD 09/12/14 1616  Elvina Sidle, MD 09/12/14 [phone]

## 2014-09-12 NOTE — Discharge Instructions (Signed)
°Strep Throat Tests °While most sore throats are caused by viruses, at times they are caused by a bacteria called group A Streptococci (strep throat). It is important to determine the cause because the strep bacteria is treated with antibiotic medication. °There are 2 types of tests for strep throat: a rapid strep test and a throat culture. Both tests are done by wiping a swab over the back of the throat and then using chemicals to identify the type of bacteria present. The rapid strep test takes 10 to 20 minutes. If the rapid strep test is negative, a throat culture may be performed to confirm the results. With a throat culture, the swab is used to spread the bacteria on a gel plate and grow it in a lab, which may take 1 to 2 days. In some cases, the culture will detect strep bacteria not found with the rapid strep test. If the result of the rapid strep test is positive, no further testing is needed, and your caregiver will prescribe antibiotics. °Not all test results are available during your visit. If your test results are not back during the visit, make an appointment with your caregiver to find out the results. Do not assume everything is normal if you have not heard from your caregiver or the medical facility. It is important for you to follow up on all of your test results. °SEEK MEDICAL CARE IF:  °· Your symptoms are not improving within 1 to 2 days, or you are getting worse. °· You have any other questions or concerns. °SEEK IMMEDIATE MEDICAL CARE IF:  °· You have increased difficulty with swallowing. °· You develop trouble breathing. °· You have a fever. °Document Released: 03/19/2004 Document Revised: 05/04/2011 Document Reviewed: 05/17/2013 °ExitCare® Patient Information ©2015 [employer]. This information is not intended to replace advice given to you by your health care provider. Make sure you discuss any questions you have with your health care provider. ° °Strep Throat °Strep throat is an  infection of the throat caused by a bacteria named Streptococcus pyogenes. Your health care provider may call the infection streptococcal "tonsillitis" or "pharyngitis" depending on whether there are signs of inflammation in the tonsils or back of the throat. Strep throat is most common in children aged 5-15 years during the cold months of the year, but it can occur in people of any age during any season. This infection is spread from person to person (contagious) through coughing, sneezing, or other close contact. °SIGNS AND SYMPTOMS  °· Fever or chills. °· Painful, swollen, red tonsils or throat. °· Pain or difficulty when swallowing. °· White or yellow spots on the tonsils or throat. °· Swollen, tender lymph nodes or "glands" of the neck or under the jaw. °· Red rash all over the body (rare). °DIAGNOSIS  °Many different infections can cause the same symptoms. A test must be done to confirm the diagnosis so the right treatment can be given. A "rapid strep test" can help your health care provider make the diagnosis in a few minutes. If this test is not available, a light swab of the infected area can be used for a throat culture test. If a throat culture test is done, results are usually available in a day or two. °TREATMENT  °Strep throat is treated with antibiotic medicine. °HOME CARE INSTRUCTIONS  °· Gargle with 1 tsp of salt in 1 cup of warm water, 3-4 times per day or as needed for comfort. °· Family members who also have a   sore throat or fever should be tested for strep throat and treated with antibiotics if they have the strep infection. °· Make sure everyone in your household washes their hands well. °· Do not share food, drinking cups, or personal items that could cause the infection to spread to others. °· You may need to eat a soft food diet until your sore throat gets better. °· Drink enough water and fluids to keep your urine clear or pale yellow. This will help prevent dehydration. °· Get plenty of  rest. °· Stay home from school, day care, or work until you have been on antibiotics for 24 hours. °· Take medicines only as directed by your health care provider. °· Take your antibiotic medicine as directed by your health care provider. Finish it even if you start to feel better. °SEEK MEDICAL CARE IF:  °· The glands in your neck continue to enlarge. °· You develop a rash, cough, or earache. °· You cough up green, yellow-brown, or bloody sputum. °· You have pain or discomfort not controlled by medicines. °· Your problems seem to be getting worse rather than better. °· You have a fever. °SEEK IMMEDIATE MEDICAL CARE IF:  °· You develop any new symptoms such as vomiting, severe headache, stiff or painful neck, chest pain, shortness of breath, or trouble swallowing. °· You develop severe throat pain, drooling, or changes in your voice. °· You develop swelling of the neck, or the skin on the neck becomes red and tender. °· You develop signs of dehydration, such as fatigue, dry mouth, and decreased urination. °· You become increasingly sleepy, or you cannot wake up completely. °MAKE SURE YOU: °· Understand these instructions. °· Will watch your condition. °· Will get help right away if you are not doing well or get worse. °Document Released: 02/07/2000 Document Revised: 06/26/2013 Document Reviewed: 04/10/2010 °ExitCare® Patient Information ©2015 [employer]. This information is not intended to replace advice given to you by your health care provider. Make sure you discuss any questions you have with your health care provider. ° ° °

## 2014-09-12 NOTE — ED Notes (Signed)
Pt  Reports    sorethroat   And   Pain  When  She  Swallows   With  Onset  Of  Symptoms  Several  Days  Ago        pt reports body  Aches  As   Well

## 2014-10-16 ENCOUNTER — Emergency Department (HOSPITAL_COMMUNITY)
Admission: EM | Admit: 2014-10-16 | Discharge: 2014-10-16 | Disposition: A | Discharge status: Home / Self Care | Payer: Medicaid Other | Source: Home / Self Care | Attending: Emergency Medicine | Admitting: Emergency Medicine

## 2014-10-16 ENCOUNTER — Encounter (HOSPITAL_COMMUNITY): Payer: Self-pay | Admitting: Emergency Medicine

## 2014-10-16 DIAGNOSIS — J029 Acute pharyngitis, unspecified: Secondary | ICD-10-CM

## 2014-10-16 DIAGNOSIS — B370 Candidal stomatitis: Secondary | ICD-10-CM

## 2014-10-16 LAB — POCT RAPID STREP A: Streptococcus, Group A Screen (Direct): NEGATIVE

## 2014-10-16 MED ORDER — METHYLPREDNISOLONE ACETATE 80 MG/ML IJ SUSP
80.0000 mg | Freq: Once | INTRAMUSCULAR | Status: AC
  Administered 2014-10-16: 80 mg via INTRAMUSCULAR

## 2014-10-16 MED ORDER — METHYLPREDNISOLONE ACETATE 80 MG/ML IJ SUSP
INTRAMUSCULAR | Status: AC
  Filled 2014-10-16: qty 1

## 2014-10-16 MED ORDER — LIDOCAINE VISCOUS 2 % MT SOLN: 5.0000 mL | OROMUCOSAL | Status: DC | PRN

## 2014-10-16 MED ORDER — NYSTATIN 100000 UNIT/ML MT SUSP: 500000.0000 [IU] | Freq: Four times a day (QID) | OROMUCOSAL | Status: DC

## 2014-10-16 NOTE — ED Notes (Signed)
C/o sore throat, ear pain

## 2014-10-16 NOTE — Discharge Instructions (Signed)
You have thrush. Use the nystatin 4 times a day for the next week. Use viscous lidocaine every 4 hours as needed to numb the mouth and throat. Make sure you are drinking fluids. You should see improvement over the next 2-3 days. Follow-up as needed.

## 2014-10-16 NOTE — ED Provider Notes (Signed)
CSN: [id number]     Arrival date & time 10/16/14  1522 History   First MD Initiated Contact with Patient 10/16/14 1705     Chief Complaint  Patient presents with  . Sore Throat   (Consider location/radiation/quality/duration/timing/severity/associated sxs/prior Treatment) HPI She is a 34 year old woman here for evaluation of sore throat. She states this started approximately 5 days ago. She reports difficulty eating and drinking due to the pain. She reports some mild head congestion and a mild cough. Denies runny nose. No report of fever. She was treated for strep throat about 4 weeks ago. Her son was recently diagnosed with a viral pharyngitis.  Past Medical History  Diagnosis Date  . Anxiety   . Depression   . Arthritis   . Fibromyalgia   . Hypertension    Past Surgical History  Procedure Laterality Date  . Cesarean section     Family History  Problem Relation Age of Onset  . COPD Mother   . Mental illness Mother   . Asthma Mother   . COPD Father   . Hypertension Father   . Cancer Paternal Uncle     lung cancer    Social History  Substance Use Topics  . Smoking status: Former Smoker -- 0.50 packs/day for 20 years    Types: Cigarettes  . Smokeless tobacco: None     Comment: electronic cigarette  . Alcohol Use: No   OB History    No data available     Review of Systems As in history of present illness Allergies  Latex and Penicillins  Home Medications   Prior to Admission medications   Medication Sig Start Date End Date Taking? Authorizing Provider  albuterol (PROVENTIL HFA;VENTOLIN HFA) 108 (90 BASE) MCG/ACT inhaler Inhale 2 puffs into the lungs every 6 (six) hours as needed for wheezing or shortness of breath.    Historical Provider, MD  amitriptyline (ELAVIL) 150 MG tablet Take 1 tablet (150 mg total) by mouth at bedtime. 04/12/14   Doris Cheadle, MD  aspirin-acetaminophen-caffeine (EXCEDRIN MIGRAINE) [phone] MG per tablet Take 2 tablets by mouth daily.     Historical Provider, MD  gabapentin (NEURONTIN) 300 MG capsule Take 1 capsule (300 mg total) by mouth 3 (three) times daily. 04/12/14   Doris Cheadle, MD  HYDROcodone-acetaminophen (NORCO) 5-325 MG per tablet Take 1 tablet by mouth every 6 (six) hours as needed. 09/12/14   Elvina Sidle, MD  lidocaine (XYLOCAINE) 2 % solution Use as directed 5 mLs in the mouth or throat every 4 (four) hours as needed for mouth pain. Swish and spit. 10/16/14   Charm Rings, MD  nystatin (MYCOSTATIN) 100000 UNIT/ML suspension Take 5 mLs (500,000 Units total) by mouth 4 (four) times daily. Swish and swallow 10/16/14   Charm Rings, MD  sodium chloride (OCEAN) 0.65 % SOLN nasal spray Place 1 spray into both nostrils as needed for congestion. 03/31/14   Roxy Horseman, PA-C  Vitamin D, Ergocalciferol, (DRISDOL) 50000 UNITS CAPS capsule Take 1 capsule (50,000 Units total) by mouth every 7 (seven) days. 04/19/14   Doris Cheadle, MD   BP 107/73 mmHg  Pulse 109  Temp(Src) 100.5 F (38.1 C) (Oral)  Resp 16  SpO2 98% Physical Exam  Constitutional: She is oriented to person, place, and time. She appears well-developed and well-nourished. No distress.  HENT:  Nose: Nose normal.  Mouth/Throat: No oropharyngeal exudate.  Mild erythema of the posterior pharynx. No cobblestoning. No ulcerations seen. She has a thick white plaque on  her tongue.  Eyes: Conjunctivae are normal.  Neck: Neck supple.  Cardiovascular:  Mild tachycardia  Pulmonary/Chest: Effort normal.  Lymphadenopathy:    She has no cervical adenopathy.  Neurological: She is alert and oriented to person, place, and time.    ED Course  Procedures (including critical care time) Labs Review Labs Reviewed  POCT RAPID STREP A    Imaging Review No results found.   MDM   1. Pharyngitis   2. Thrush    Treat with nystatin. Viscous lidocaine as needed for pain. Depo-Medrol 80 mg IM given. Follow-up as needed.    Charm Rings, MD 10/16/14 [phone]

## 2014-10-18 LAB — CULTURE, GROUP A STREP: STREP A CULTURE: POSITIVE — AB

## 2014-10-19 NOTE — ED Notes (Signed)
Strep final report positive. Discussed w D Mabe, who authorized Azithromycin 250 mg, #10 , take 2 pills QD x 5 days. Attempted to reach patient at number provided at check in, but this number gives a message that states " the person at this number is not reachable ". Have sent a letter to the address provided at the time of check in

## 2014-10-23 NOTE — ED Notes (Signed)
Have not as of this writing, received any return communication from patient. Still unable to reach via telephone

## 2014-11-01 MED ORDER — CEPHALEXIN 500 MG PO CAPS: 500.0000 mg | ORAL_CAPSULE | Freq: Three times a day (TID) | ORAL | Status: DC

## 2014-11-01 MED ORDER — BENZOCAINE (TOPICAL) 20 % EX AERO: 1.0000 "application " | INHALATION_SPRAY | Freq: Four times a day (QID) | CUTANEOUS | Status: DC | PRN

## 2014-11-21 ENCOUNTER — Emergency Department (HOSPITAL_COMMUNITY): Payer: Medicaid Other

## 2014-11-21 ENCOUNTER — Emergency Department (HOSPITAL_COMMUNITY)
Admission: EM | Admit: 2014-11-21 | Discharge: 2014-11-21 | Disposition: A | Discharge status: Home / Self Care | Payer: Medicaid Other | Attending: Emergency Medicine | Admitting: Emergency Medicine

## 2014-11-21 ENCOUNTER — Emergency Department (HOSPITAL_COMMUNITY)
Admission: EM | Admit: 2014-11-21 | Discharge: 2014-11-21 | Disposition: A | Discharge status: Home / Self Care | Payer: Medicaid Other | Source: Home / Self Care

## 2014-11-21 ENCOUNTER — Encounter (HOSPITAL_COMMUNITY): Payer: Self-pay | Admitting: *Deleted

## 2014-11-21 DIAGNOSIS — F329 Major depressive disorder, single episode, unspecified: Secondary | ICD-10-CM | POA: Diagnosis not present

## 2014-11-21 DIAGNOSIS — F419 Anxiety disorder, unspecified: Secondary | ICD-10-CM | POA: Diagnosis not present

## 2014-11-21 DIAGNOSIS — Y939 Activity, unspecified: Secondary | ICD-10-CM | POA: Diagnosis not present

## 2014-11-21 DIAGNOSIS — S3992XA Unspecified injury of lower back, initial encounter: Secondary | ICD-10-CM | POA: Diagnosis present

## 2014-11-21 DIAGNOSIS — Y999 Unspecified external cause status: Secondary | ICD-10-CM | POA: Insufficient documentation

## 2014-11-21 DIAGNOSIS — Z91040 Latex allergy status: Secondary | ICD-10-CM | POA: Diagnosis not present

## 2014-11-21 DIAGNOSIS — M1990 Unspecified osteoarthritis, unspecified site: Secondary | ICD-10-CM | POA: Insufficient documentation

## 2014-11-21 DIAGNOSIS — Z792 Long term (current) use of antibiotics: Secondary | ICD-10-CM | POA: Insufficient documentation

## 2014-11-21 DIAGNOSIS — I10 Essential (primary) hypertension: Secondary | ICD-10-CM | POA: Insufficient documentation

## 2014-11-21 DIAGNOSIS — Z79899 Other long term (current) drug therapy: Secondary | ICD-10-CM | POA: Insufficient documentation

## 2014-11-21 DIAGNOSIS — Y929 Unspecified place or not applicable: Secondary | ICD-10-CM | POA: Diagnosis not present

## 2014-11-21 DIAGNOSIS — Z880 Allergy status to penicillin: Secondary | ICD-10-CM | POA: Insufficient documentation

## 2014-11-21 DIAGNOSIS — W19XXXA Unspecified fall, initial encounter: Secondary | ICD-10-CM

## 2014-11-21 DIAGNOSIS — S8991XA Unspecified injury of right lower leg, initial encounter: Secondary | ICD-10-CM | POA: Insufficient documentation

## 2014-11-21 DIAGNOSIS — W109XXA Fall (on) (from) unspecified stairs and steps, initial encounter: Secondary | ICD-10-CM | POA: Insufficient documentation

## 2014-11-21 DIAGNOSIS — S8992XA Unspecified injury of left lower leg, initial encounter: Secondary | ICD-10-CM | POA: Diagnosis not present

## 2014-11-21 DIAGNOSIS — M545 Low back pain, unspecified: Secondary | ICD-10-CM

## 2014-11-21 DIAGNOSIS — Z87891 Personal history of nicotine dependence: Secondary | ICD-10-CM | POA: Insufficient documentation

## 2014-11-21 MED ORDER — CYCLOBENZAPRINE HCL 10 MG PO TABS: 5.0000 mg | ORAL_TABLET | Freq: Two times a day (BID) | ORAL | Status: DC | PRN

## 2014-11-21 MED ORDER — IBUPROFEN 400 MG PO TABS
800.0000 mg | ORAL_TABLET | Freq: Once | ORAL | Status: AC
  Administered 2014-11-21: 800 mg via ORAL
  Filled 2014-11-21: qty 2

## 2014-11-21 MED ORDER — OXYCODONE-ACETAMINOPHEN 5-325 MG PO TABS
1.0000 | ORAL_TABLET | Freq: Once | ORAL | Status: AC
  Administered 2014-11-21: 1 via ORAL
  Filled 2014-11-21: qty 1

## 2014-11-21 MED ORDER — NAPROXEN 500 MG PO TABS: 500.0000 mg | ORAL_TABLET | Freq: Two times a day (BID) | ORAL | Status: DC

## 2014-11-21 NOTE — ED Notes (Signed)
Pt reports falling down approx 4-5 steps pta. No loc. Having lower back pain that radiates down both legs. Ambulatory at triage.

## 2014-11-21 NOTE — Discharge Instructions (Signed)
Back Pain, Adult °Low back pain is very common. About 1 in 5 people have back pain. The cause of low back pain is rarely dangerous. The pain often gets better over time. About half of people with a sudden onset of back pain feel better in just 2 weeks. About 8 in 10 people feel better by 6 weeks.  °CAUSES °Some common causes of back pain include: °· Strain of the muscles or ligaments supporting the spine. °· Wear and tear (degeneration) of the spinal discs. °· Arthritis. °· Direct injury to the back. °DIAGNOSIS °Most of the time, the direct cause of low back pain is not known. However, back pain can be treated effectively even when the exact cause of the pain is unknown. Answering your caregiver's questions about your overall health and symptoms is one of the most accurate ways to make sure the cause of your pain is not dangerous. If your caregiver needs more information, he or she may order lab work or imaging tests (X-rays or MRIs). However, even if imaging tests show changes in your back, this usually does not require surgery. °HOME CARE INSTRUCTIONS °For many people, back pain returns. Since low back pain is rarely dangerous, it is often a condition that people can learn to manage on their own.  °· Remain active. It is stressful on the back to sit or stand in one place. Do not sit, drive, or stand in one place for more than 30 minutes at a time. Take short walks on level surfaces as soon as pain allows. Try to increase the length of time you walk each day. °· Do not stay in bed. Resting more than 1 or 2 days can delay your recovery. °· Do not avoid exercise or work. Your body is made to move. It is not dangerous to be active, even though your back may hurt. Your back will likely heal faster if you return to being active before your pain is gone. °· Pay attention to your body when you  bend and lift. Many people have less discomfort when lifting if they bend their knees, keep the load close to their bodies, and  avoid twisting. Often, the most comfortable positions are those that put less stress on your recovering back. °· Find a comfortable position to sleep. Use a firm mattress and lie on your side with your knees slightly bent. If you lie on your back, put a pillow under your knees. °· Only take over-the-counter or prescription medicines as directed by your caregiver. Over-the-counter medicines to reduce pain and inflammation are often the most helpful. Your caregiver may prescribe muscle relaxant drugs. These medicines help dull your pain so you can more quickly return to your normal activities and healthy exercise. °· Put ice on the injured area. °¨ Put ice in a plastic bag. °¨ Place a towel between your skin and the bag. °¨ Leave the ice on for 15-20 minutes, 03-04 times a day for the first 2 to 3 days. After that, ice and heat may be alternated to reduce pain and spasms. °· Ask your caregiver about trying back exercises and gentle massage. This may be of some benefit. °· Avoid feeling anxious or stressed. Stress increases muscle tension and can worsen back pain. It is important to recognize when you are anxious or stressed and learn ways to manage it. Exercise is a great option. °SEEK MEDICAL CARE IF: °· You have pain that is not relieved with rest or medicine. °· You have pain that does not improve in 1 week. °· You have new symptoms. °· You are generally not feeling well. °SEEK   IMMEDIATE MEDICAL CARE IF:  °· You have pain that radiates from your back into your legs. °· You develop new bowel or bladder control problems. °· You have unusual weakness or numbness in your arms or legs. °· You develop nausea or vomiting. °· You develop abdominal pain. °· You feel faint. °Document Released: 02/09/2005 Document Revised: 08/11/2011 Document Reviewed: 06/13/2013 °Exit[employer]® Patient Information ©2015 [employer]. This information is not intended to replace advice given to you by your health care provider. Make sure you  discuss any questions you have with your health care provider. °Fall Prevention and Home Safety °Falls cause injuries and can affect all age groups. It is possible to use preventive measures to significantly decrease the likelihood of falls. There are many simple measures which can make your home safer and prevent falls. °OUTDOORS °· Repair cracks and edges of walkways and driveways. °· Remove high doorway thresholds. °· Trim shrubbery on the main path into your home. °· Have good outside lighting. °· Clear walkways of tools, rocks, debris, and clutter. °· Check that handrails are not broken and are securely fastened. Both sides of steps should have handrails. °· Have leaves, snow, and ice cleared regularly. °· Use sand or salt on walkways during winter months. °· In the garage, clean up grease or oil spills. °BATHROOM °· Install night lights. °· Install grab bars by the toilet and in the tub and shower. °· Use non-skid mats or decals in the tub or shower. °· Place a plastic non-slip stool in the shower to sit on, if needed. °· Keep floors dry and clean up all water on the floor immediately. °· Remove soap buildup in the tub or shower on a regular basis. °· Secure bath mats with non-slip, double-sided rug tape. °· Remove throw rugs and tripping hazards from the floors. °BEDROOMS °· Install night lights. °· Make sure a bedside light is easy to reach. °· Do not use oversized bedding. °· Keep a telephone by your bedside. °· Have a firm chair with side arms to use for getting dressed. °· Remove throw rugs and tripping hazards from the floor. °KITCHEN °· Keep handles on pots and pans turned toward the center of the stove. Use back burners when possible. °· Clean up spills quickly and allow time for drying. °· Avoid walking on wet floors. °· Avoid hot utensils and knives. °· Position shelves so they are not too high or low. °· Place commonly used objects within easy reach. °· If necessary, use a sturdy step stool with a  grab bar when reaching. °· Keep electrical cables out of the way. °· Do not use floor polish or wax that makes floors slippery. If you must use wax, use non-skid floor wax. °· Remove throw rugs and tripping hazards from the floor. °STAIRWAYS °· Never leave objects on stairs. °· Place handrails on both sides of stairways and use them. Fix any loose handrails. Make sure handrails on both sides of the stairways are as long as the stairs. °· Check carpeting to make sure it is firmly attached along stairs. Make repairs to worn or loose carpet promptly. °· Avoid placing throw rugs at the top or bottom of stairways, or properly secure the rug with carpet tape to prevent slippage. Get rid of throw rugs, if possible. °· Have an electrician put in a light switch at the top and bottom of the stairs. °OTHER FALL PREVENTION TIPS °· Wear low-heel or rubber-soled shoes that are supportive and fit well. Wear   closed toe shoes. °· When using a stepladder, make sure it is fully opened and both spreaders are firmly locked. Do not climb a closed stepladder. °· Add color or contrast paint or tape to grab bars and handrails in your home. Place contrasting color strips on first and last steps. °· Learn and use mobility aids as needed. Install an electrical emergency response system. °· Turn on lights to avoid dark areas. Replace light bulbs that burn out immediately. Get light switches that glow. °· Arrange furniture to create clear pathways. Keep furniture in the same place. °· Firmly attach carpet with non-skid or double-sided tape. °· Eliminate uneven floor surfaces. °· Select a carpet pattern that does not visually hide the edge of steps. °· Be aware of all pets. °OTHER HOME SAFETY TIPS °· Set the water temperature for 120° F (48.8° C). °· Keep emergency numbers on or near the telephone. °· Keep smoke detectors on every level of the home and near sleeping areas. °Document Released: 01/30/2002 Document Revised: 08/11/2011 Document  Reviewed: 05/01/2011 °ExitCare® Patient Information ©2015 [employer]. This information is not intended to replace advice given to you by your health care provider. Make sure you discuss any questions you have with your health care provider. ° °

## 2014-11-21 NOTE — ED Provider Notes (Signed)
CSN: [id number]     Arrival date & time 11/21/14  1624 History  By signing my name below, I, Octavia Heir, attest that this documentation has been prepared under the direction and in the presence of Marlon Pel, PA-C. Electronically Signed: Octavia Heir, ED Scribe. 11/21/2014. 6:51 PM.    Chief Complaint  Patient presents with  . Fall  . Back Pain      The history is provided by the patient. No language interpreter was used.   HPI Comments: Monica Vance is a 34 y.o. female who has a hx of degenerative disk disease presents to the Emergency Department complaining of a sudden fall that occurred 3 hours ago. She reports having lower back pain and bilateral leg pain. She notes her legs feel as if they are burning and states they are numb. Pt reports falling down approximately 4-5 steps outside due to tripping because of her son. She did not lose consciousness. She states it is very painful when she walks and notes she ambulates very slowly. Pt denies bowel/bladder incontinence, neck injury, and head injury. Denies bruising, deformity, weakness or numbness.  Past Medical History  Diagnosis Date  . Anxiety   . Depression   . Arthritis   . Fibromyalgia   . Hypertension    Past Surgical History  Procedure Laterality Date  . Cesarean section     Family History  Problem Relation Age of Onset  . COPD Mother   . Mental illness Mother   . Asthma Mother   . COPD Father   . Hypertension Father   . Cancer Paternal Uncle     lung cancer    Social History  Substance Use Topics  . Smoking status: Former Smoker -- 0.50 packs/day for 20 years    Types: Cigarettes  . Smokeless tobacco: None     Comment: electronic cigarette  . Alcohol Use: No   OB History    No data available     Review of Systems  Musculoskeletal: Positive for back pain and arthralgias. Negative for neck pain.  Neurological: Positive for numbness.  All other systems reviewed and are  negative.   Allergies  Latex and Penicillins  Home Medications   Prior to Admission medications   Medication Sig Start Date End Date Taking? Authorizing Provider  albuterol (PROVENTIL HFA;VENTOLIN HFA) 108 (90 BASE) MCG/ACT inhaler Inhale 2 puffs into the lungs every 6 (six) hours as needed for wheezing or shortness of breath.    Historical Provider, MD  amitriptyline (ELAVIL) 150 MG tablet Take 1 tablet (150 mg total) by mouth at bedtime. 04/12/14   Doris Cheadle, MD  aspirin-acetaminophen-caffeine (EXCEDRIN MIGRAINE) [phone] MG per tablet Take 2 tablets by mouth daily.    Historical Provider, MD  benzocaine (HURRICAINE) 20 % oral spray Use as directed 1 application in the mouth or throat 4 (four) times daily as needed for throat irritation / pain. 11/01/14   Ozella Rocks, MD  cephALEXin (KEFLEX) 500 MG capsule Take 1 capsule (500 mg total) by mouth 3 (three) times daily. 11/01/14   Ozella Rocks, MD  cyclobenzaprine (FLEXERIL) 10 MG tablet Take 0.5-1 tablets (5-10 mg total) by mouth 2 (two) times daily as needed. 11/21/14   Kimmi Acocella Neva Seat, PA-C  gabapentin (NEURONTIN) 300 MG capsule Take 1 capsule (300 mg total) by mouth 3 (three) times daily. 04/12/14   Doris Cheadle, MD  HYDROcodone-acetaminophen (NORCO) 5-325 MG per tablet Take 1 tablet by mouth every 6 (six) hours as needed. 09/12/14  Elvina Sidle, MD  lidocaine (XYLOCAINE) 2 % solution Use as directed 5 mLs in the mouth or throat every 4 (four) hours as needed for mouth pain. Swish and spit. 10/16/14   Charm Rings, MD  naproxen (NAPROSYN) 500 MG tablet Take 1 tablet (500 mg total) by mouth 2 (two) times daily. 11/21/14   Grae Leathers Neva Seat, PA-C  nystatin (MYCOSTATIN) 100000 UNIT/ML suspension Take 5 mLs (500,000 Units total) by mouth 4 (four) times daily. Swish and swallow 10/16/14   Charm Rings, MD  sodium chloride (OCEAN) 0.65 % SOLN nasal spray Place 1 spray into both nostrils as needed for congestion. 03/31/14   Roxy Horseman, PA-C   Vitamin D, Ergocalciferol, (DRISDOL) 50000 UNITS CAPS capsule Take 1 capsule (50,000 Units total) by mouth every 7 (seven) days. 04/19/14   Doris Cheadle, MD   Triage vitals: BP 110/79 mmHg  Pulse 105  Temp(Src) 98.1 F (36.7 C) (Oral)  Resp 20  SpO2 99% Physical Exam  Constitutional: She appears well-developed and well-nourished. No distress.  HENT:  Head: Normocephalic and atraumatic.  Eyes: Right eye exhibits no discharge. Left eye exhibits no discharge.  Pulmonary/Chest: Effort normal. No respiratory distress.  Musculoskeletal:       Arms: Symmetrical and physiologic strength to bilateral lower extremities.  Neurosensory function adequate to both legs Skin color is normal. Skin is warm and moist.  No step off deformity appreciated and no midline bony tenderness.  Can ambulate with some discomfort.  No crepitus, laceration, effusion, induration, lesions Pedal pulses are symmetrical and palpable bilaterally  No tenderness to midline, positive tenderness to paraspinal muscles No clonus on dorsiflextion   Neurological: She is alert. Coordination normal.  Skin: No rash noted. She is not diaphoretic.  Psychiatric: She has a normal mood and affect. Her behavior is normal.  Nursing note and vitals reviewed.   ED Course  Procedures  DIAGNOSTIC STUDIES: Oxygen Saturation is 99% on RA, normal by my interpretation.  COORDINATION OF CARE:  5:23 PM Discussed treatment plan with pt at bedside and pt agreed to plan.  Labs Review Labs Reviewed - No data to display  Imaging Review Dg Lumbar Spine Complete  11/21/2014   CLINICAL DATA:  Larey Seat down steps today.  Low back pain.  EXAM: LUMBAR SPINE - COMPLETE 4+ VIEW  COMPARISON:  Lumbar spine MRI 02/26/2011  FINDINGS: Normal alignment of the lumbar vertebral bodies. Disc spaces and vertebral bodies are maintained. The facets are normally aligned. No pars defects. The visualized bony pelvis is intact.  IMPRESSION: Normal alignment and no  acute bony findings.   Electronically Signed   By: Rudie Meyer M.D.   On: 11/21/2014 18:40   I have personally reviewed and evaluated these images and lab results as part of my medical decision-making.   EKG Interpretation None      MDM   Final diagnoses:  Fall, initial encounter  Right-sided low back pain without sciatica    34 y.o.Monica Vance's  with back pain.   No neurological deficits and normal neuro exam. No loss of bowel or bladder control. No concern for cauda equina at this time base on HPI and physical exam findings. No fever, night sweats, weight loss, h/o cancer, IVDU. The patient can walk with some discomfort.   Patient Plan 1. Medications: NSAIDs and/or muscle relaxer. Cont usual home medications unless otherwise directed. 2. Treatment: rest, drink plenty of fluids, gentle stretching as discussed, alternate ice and heat  3. Follow Up: Please followup with  your primary doctor for discussion of your diagnoses and further evaluation after today's visit; if you do not have a primary care doctor use the resource guide provided to find one  Advised to follow-up with the orthopedist if symptoms do not start to resolve in the next 2-3 days. If develop loss of bowel or urinary control return to the ED as soon as possible for further evaluation. To take the medications as prescribed as they can cause harm if not taken appropriately.   Vital signs are stable at discharge. Filed Vitals:   11/21/14 1656  BP: 110/79  Pulse: 105  Temp: 98.1 F (36.7 C)  Resp: 20    Patient/guardian has voiced understanding and agreed to follow-up with the PCP or specialist.  I personally performed the services described in this documentation, which was scribed in my presence. The recorded information has been reviewed and is accurate.      Marlon Pel, PA-C 11/21/14 1858  Lavera Guise, MD 11/22/14 [phone]

## 2015-05-14 ENCOUNTER — Encounter (HOSPITAL_COMMUNITY): Payer: Self-pay | Admitting: Emergency Medicine

## 2015-05-14 DIAGNOSIS — H9201 Otalgia, right ear: Secondary | ICD-10-CM | POA: Diagnosis not present

## 2015-05-14 DIAGNOSIS — M546 Pain in thoracic spine: Secondary | ICD-10-CM | POA: Insufficient documentation

## 2015-05-14 DIAGNOSIS — R05 Cough: Secondary | ICD-10-CM | POA: Insufficient documentation

## 2015-05-14 DIAGNOSIS — I10 Essential (primary) hypertension: Secondary | ICD-10-CM | POA: Diagnosis not present

## 2015-05-14 NOTE — ED Notes (Signed)
Pt. reports right upper back pain onset today , denies injury , dry cough for 2 weeks , denies SOB , and right earache onset denies injury / no drainage .

## 2015-05-15 ENCOUNTER — Emergency Department (HOSPITAL_COMMUNITY)
Admission: EM | Admit: 2015-05-15 | Discharge: 2015-05-15 | Disposition: A | Discharge status: Home / Self Care | Payer: Medicaid Other | Attending: Emergency Medicine | Admitting: Emergency Medicine

## 2015-05-15 NOTE — ED Notes (Signed)
Pts name called for xray no answer

## 2015-11-25 IMAGING — DX DG LUMBAR SPINE COMPLETE 4+V
5 series · 5 of 5 positions shown · non-contrast
Comparison: Lumbar spine MRI 02/26/2011

CLINICAL DATA: Fell down steps today.  Low back pain.

EXAM:
LUMBAR SPINE - COMPLETE 4+ VIEW

[l-spine ap]
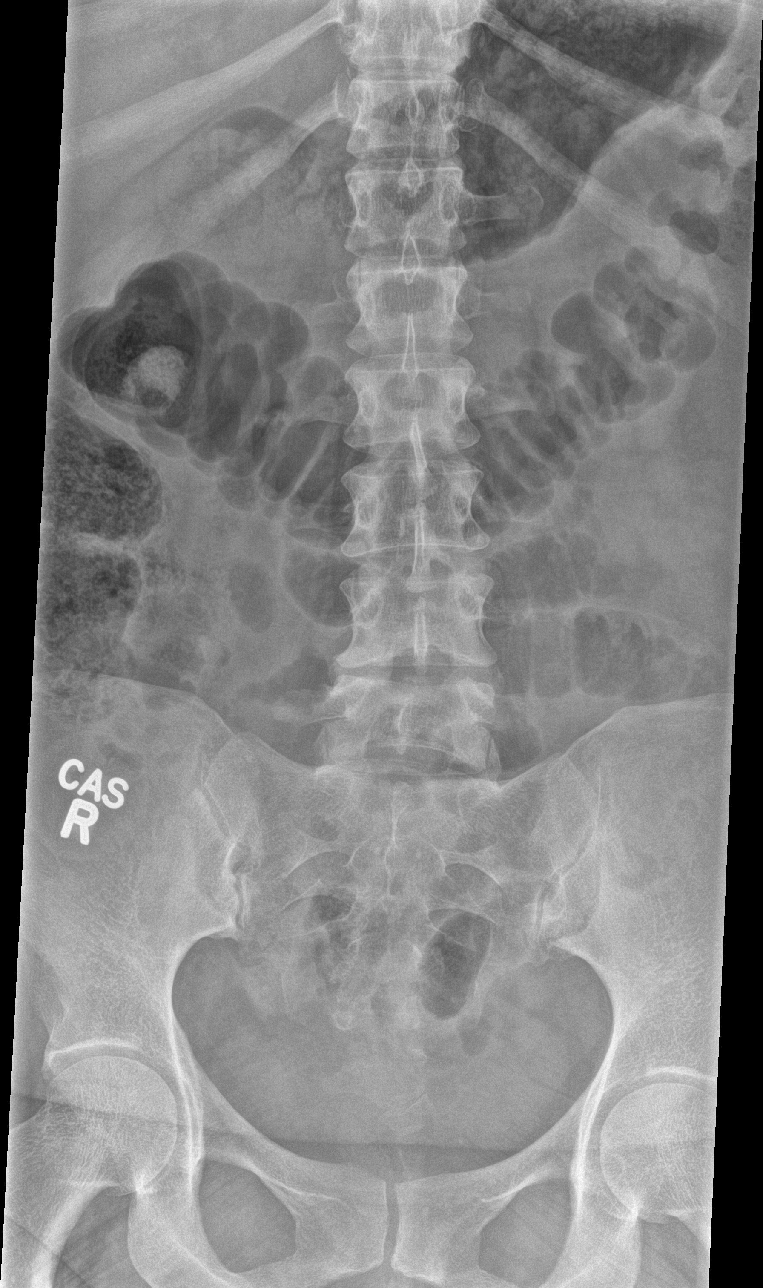

[l-spine obl (1 of 2)]
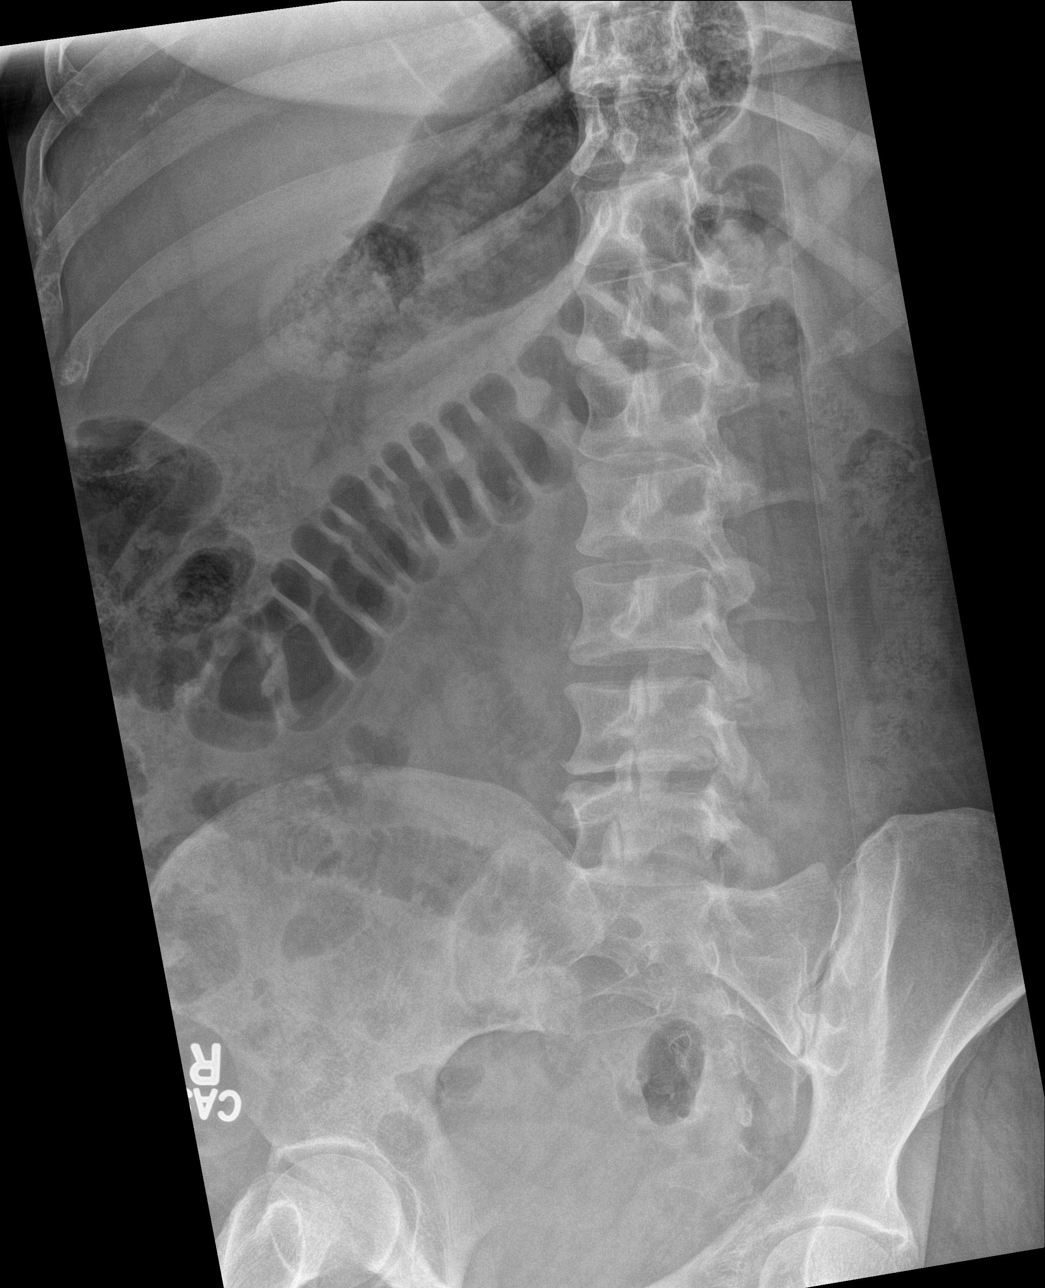

[l-spine obl (2 of 2)]
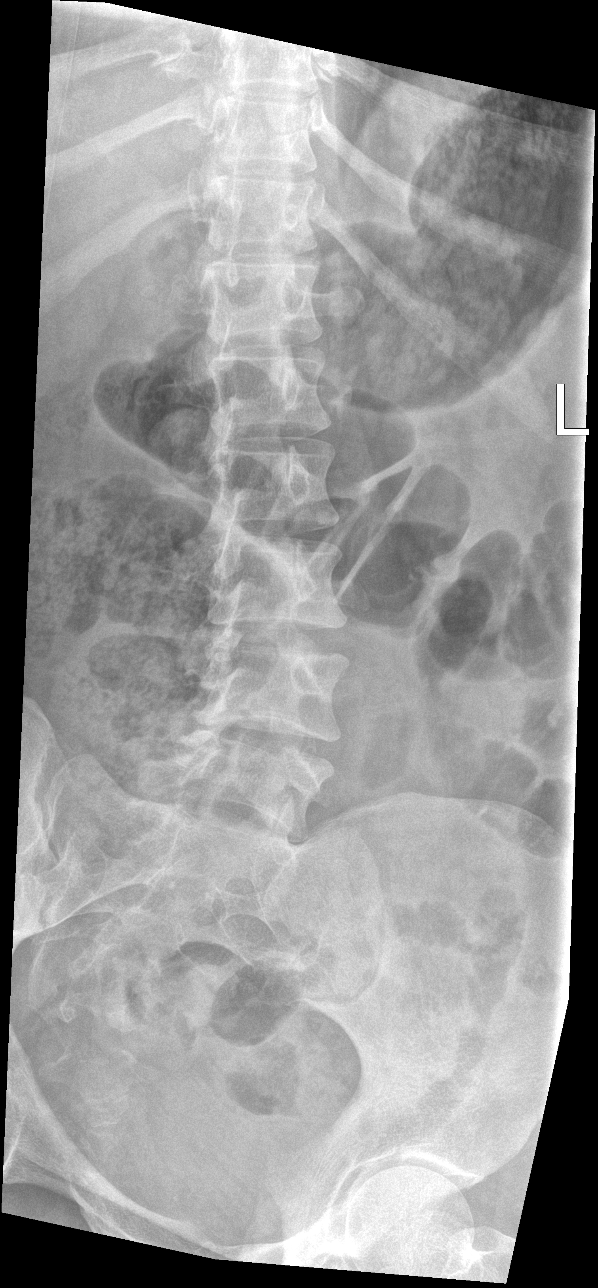

[l-spine lat]
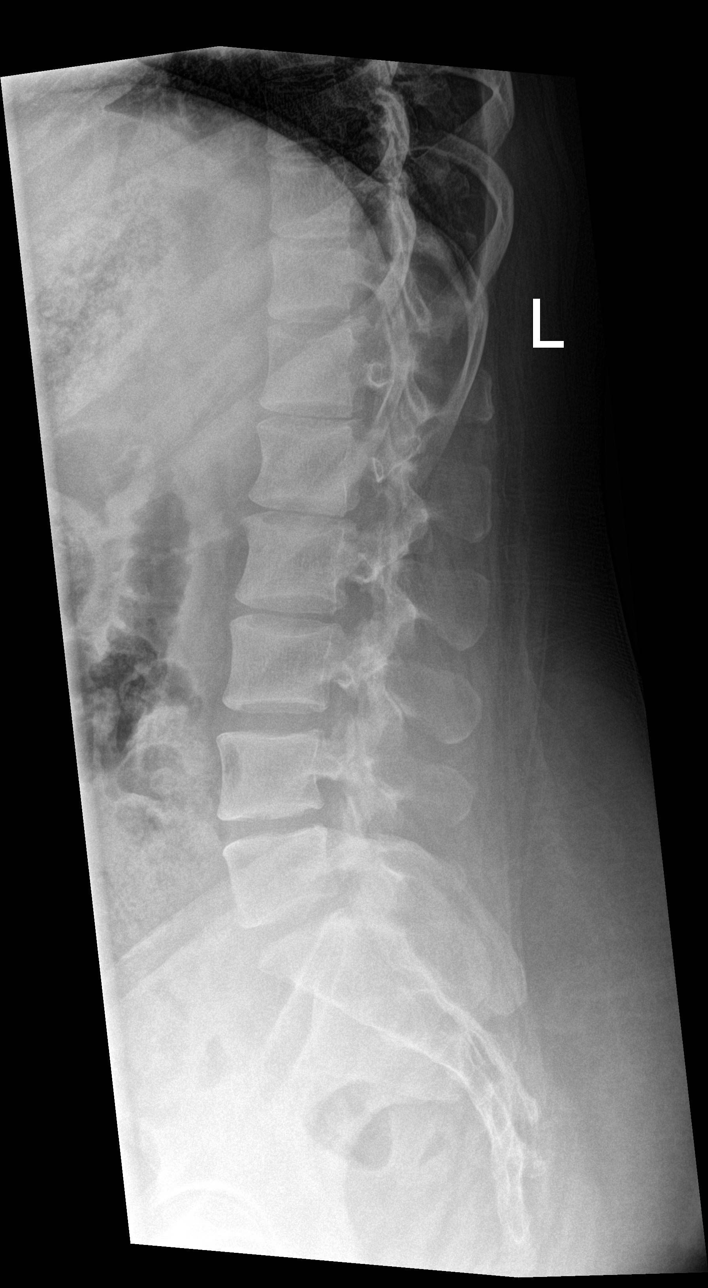

[l-spine spot]
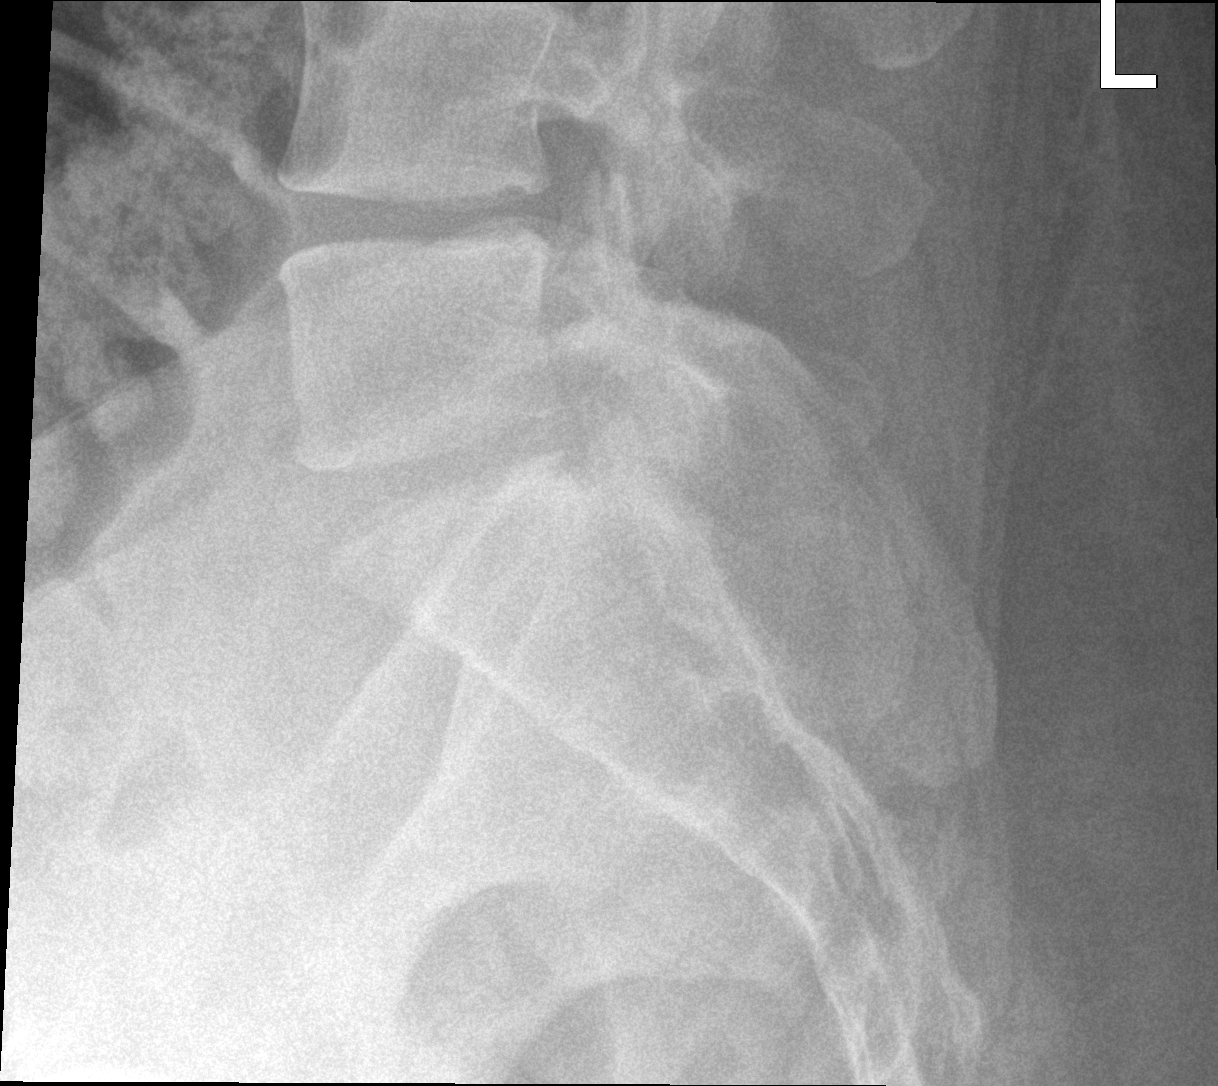

[5 of 5 positions shown; findings below may reference images not displayed]

FINDINGS: Normal alignment of the lumbar vertebral bodies. Disc spaces and
vertebral bodies are maintained. The facets are normally aligned. No
pars defects. The visualized bony pelvis is intact.
IMPRESSION: Normal alignment and no acute bony findings.

## 2016-06-16 ENCOUNTER — Ambulatory Visit (HOSPITAL_COMMUNITY)
Admission: EM | Admit: 2016-06-16 | Discharge: 2016-06-16 | Disposition: A | Discharge status: Home / Self Care | Payer: Medicaid Other | Attending: Family | Admitting: Family

## 2016-06-16 ENCOUNTER — Encounter (HOSPITAL_COMMUNITY): Payer: Self-pay | Admitting: Family Medicine

## 2016-06-16 DIAGNOSIS — R05 Cough: Secondary | ICD-10-CM

## 2016-06-16 DIAGNOSIS — R110 Nausea: Secondary | ICD-10-CM | POA: Diagnosis not present

## 2016-06-16 DIAGNOSIS — J0140 Acute pansinusitis, unspecified: Secondary | ICD-10-CM

## 2016-06-16 DIAGNOSIS — R059 Cough, unspecified: Secondary | ICD-10-CM

## 2016-06-16 MED ORDER — DOXYCYCLINE HYCLATE 100 MG PO CAPS: 100.0000 mg | ORAL_CAPSULE | Freq: Two times a day (BID) | ORAL | 0 refills | Status: AC

## 2016-06-16 MED ORDER — ONDANSETRON HCL 8 MG PO TABS: 8.0000 mg | ORAL_TABLET | Freq: Three times a day (TID) | ORAL | 0 refills | Status: DC | PRN

## 2016-06-16 NOTE — ED Provider Notes (Signed)
CSN: [id number]     Arrival date & time 06/16/16  1629 History   First MD Initiated Contact with Patient 06/16/16 1742     Chief Complaint  Patient presents with  . Nausea  . Cough   (Consider location/radiation/quality/duration/timing/severity/associated sxs/prior Treatment) 36 year old female presents with nasal congestion, sinus pressure, ear pain, sore throat and cough for over 2 weeks. Also experiencing occasional fever, nausea and vomiting. Has taken Coricidan allergy, Excedrin, Ibuprofen and Zantac with no relief. Has history of fibromyalgia and on chronic meds to manage pain. Quit smoking last year. Son also has been sick for over a week with similar symptoms.    The history is provided by the patient.    Past Medical History:  Diagnosis Date  . Anxiety   . Arthritis   . Depression   . Fibromyalgia   . Hypertension    Past Surgical History:  Procedure Laterality Date  . CESAREAN SECTION     Family History  Problem Relation Age of Onset  . COPD Mother   . Mental illness Mother   . Asthma Mother   . COPD Father   . Hypertension Father   . Cancer Paternal Uncle     lung cancer    Social History  Substance Use Topics  . Smoking status: Former Smoker    Packs/day: 0.50    Years: 20.00    Types: Cigarettes  . Smokeless tobacco: Never Used     Comment: electronic cigarette  . Alcohol use No   OB History    No data available     Review of Systems  Constitutional: Positive for appetite change, fatigue and fever. Negative for chills.  HENT: Positive for congestion, ear pain, postnasal drip, sinus pain, sinus pressure and sore throat. Negative for ear discharge, mouth sores, nosebleeds, rhinorrhea, sneezing and trouble swallowing.   Eyes: Negative for discharge and itching.  Respiratory: Positive for cough and chest tightness. Negative for shortness of breath and wheezing.   Cardiovascular: Negative for chest pain.  Gastrointestinal: Positive for nausea and  vomiting. Negative for abdominal pain, blood in stool and diarrhea.  Genitourinary: Negative for dysuria.  Musculoskeletal: Positive for arthralgias and myalgias. Negative for back pain, neck pain and neck stiffness.  Skin: Negative for rash and wound.  Neurological: Positive for headaches. Negative for dizziness, syncope, weakness, light-headedness and numbness.  Hematological: Negative for adenopathy.    Allergies  Latex and Penicillins  Home Medications   Prior to Admission medications   Medication Sig Start Date End Date Taking? Authorizing Provider  albuterol (PROVENTIL HFA;VENTOLIN HFA) 108 (90 BASE) MCG/ACT inhaler Inhale 2 puffs into the lungs every 6 (six) hours as needed for wheezing or shortness of breath.    Historical Provider, MD  amitriptyline (ELAVIL) 150 MG tablet Take 1 tablet (150 mg total) by mouth at bedtime. 04/12/14   Doris Cheadle, MD  aspirin-acetaminophen-caffeine (EXCEDRIN MIGRAINE) [id number] MG per tablet Take 2 tablets by mouth daily.    Historical Provider, MD  doxycycline (VIBRAMYCIN) 100 MG capsule Take 1 capsule (100 mg total) by mouth 2 (two) times daily. 06/16/16 06/23/16  Sudie Grumbling, NP  gabapentin (NEURONTIN) 300 MG capsule Take 1 capsule (300 mg total) by mouth 3 (three) times daily. 04/12/14   Doris Cheadle, MD  naproxen (NAPROSYN) 500 MG tablet Take 1 tablet (500 mg total) by mouth 2 (two) times daily. 11/21/14   Tiffany Neva Seat, PA-C  ondansetron (ZOFRAN) 8 MG tablet Take 1 tablet (8 mg total) by  mouth every 8 (eight) hours as needed for nausea or vomiting. 06/16/16   Sudie Grumbling, NP  Vitamin D, Ergocalciferol, (DRISDOL) 50000 UNITS CAPS capsule Take 1 capsule (50,000 Units total) by mouth every 7 (seven) days. 04/19/14   Doris Cheadle, MD   Meds Ordered and Administered this Visit  Medications - No data to display  BP 113/75   Pulse (!) 108   Temp 98.1 F (36.7 C)   Resp 18   SpO2 99%  No data found.   Physical Exam  Constitutional: She  is oriented to person, place, and time. She appears well-developed and well-nourished. She appears ill. No distress.  HENT:  Head: Normocephalic and atraumatic.  Right Ear: Hearing, external ear and ear canal normal. No drainage. Tympanic membrane is scarred and bulging. Tympanic membrane is not injected and not perforated. A middle ear effusion is present.  Left Ear: Hearing, external ear and ear canal normal. No drainage. Tympanic membrane is scarred and bulging. Tympanic membrane is not injected and not perforated. A middle ear effusion is present.  Nose: Mucosal edema and rhinorrhea present. Right sinus exhibits maxillary sinus tenderness and frontal sinus tenderness. Left sinus exhibits maxillary sinus tenderness and frontal sinus tenderness.  Mouth/Throat: Uvula is midline and mucous membranes are normal. Posterior oropharyngeal erythema present.  Neck: Normal range of motion. Neck supple.  Cardiovascular: Regular rhythm and normal heart sounds.  Tachycardia present.   Pulmonary/Chest: Effort normal. No respiratory distress. She has decreased breath sounds in the right upper field, the right lower field, the left upper field and the left lower field. She has wheezes in the right upper field and the left upper field. She has no rhonchi. She has no rales.  Lymphadenopathy:    She has no cervical adenopathy.  Neurological: She is alert and oriented to person, place, and time.  Skin: Skin is warm and dry. Capillary refill takes less than 2 seconds.  Psychiatric: She has a normal mood and affect. Her behavior is normal. Judgment and thought content normal.    Urgent Care Course     Procedures (including critical care time)  Labs Review Labs Reviewed - No data to display  Imaging Review No results found.   Visual Acuity Review  Right Eye Distance:   Left Eye Distance:   Bilateral Distance:    Right Eye Near:   Left Eye Near:    Bilateral Near:         MDM   1. Acute  non-recurrent pansinusitis   2. Nausea   3. Cough    Recommend start Doxycycline  twice a day as directed. May take Zofran  every 8 hours as needed for nausea. Increase fluid intake to help loosen up mucus. May take Ibuprofen  every 8 hours as needed for sinus pain. Encouraged to use Albuterol 2 puffs every 6 hours as needed for any wheezing or chest tightness. Follow-up with her primary care provider in 3 to 4 days if not improving.      Sudie Grumbling, NP 06/17/16 [phone]

## 2016-06-16 NOTE — ED Triage Notes (Signed)
Pt here for URI symptoms and nausea.

## 2016-06-16 NOTE — Discharge Instructions (Addendum)
Recommend start Doxycycline  twice a day as directed. May take Zofran  every 8 hours as needed for nausea. Increase fluid intake to help loosen up mucus. May take Ibuprofen  every 8 hours as needed for sinus pain. Follow-up with your primary care provider in 3 to 4 days if not improving.

## 2017-06-10 ENCOUNTER — Ambulatory Visit (HOSPITAL_COMMUNITY)
Admission: EM | Admit: 2017-06-10 | Discharge: 2017-06-10 | Disposition: A | Discharge status: Home / Self Care | Payer: PRIVATE HEALTH INSURANCE | Attending: Family Medicine | Admitting: Family Medicine

## 2017-06-10 ENCOUNTER — Encounter (HOSPITAL_COMMUNITY): Payer: Self-pay | Admitting: Family Medicine

## 2017-06-10 DIAGNOSIS — J320 Chronic maxillary sinusitis: Secondary | ICD-10-CM | POA: Diagnosis not present

## 2017-06-10 MED ORDER — DOXYCYCLINE HYCLATE 100 MG PO CAPS: 100.0000 mg | ORAL_CAPSULE | Freq: Two times a day (BID) | ORAL | 0 refills | Status: DC

## 2017-06-10 NOTE — ED Triage Notes (Signed)
Pt here for cough, congestion, runny nose, headache for about a month. She vomited at work today. She has been taking OTC meds. Her child was sick about a month ago.

## 2017-06-10 NOTE — ED Provider Notes (Signed)
[facility]    CSN: [id number] Arrival date & time: 06/10/17  1446     History   Chief Complaint Chief Complaint  Patient presents with  . Cough    HPI Monica Vance is a 37 y.o. female.   Patient has a nonproductive cough postnasal drainage headache earache.  She was treated for similar symptoms almost 1 year to the day.  She does endorse seasonal rhinitis.  HPI  Past Medical History:  Diagnosis Date  . Anxiety   . Arthritis   . Depression   . Fibromyalgia   . Hypertension     Patient Active Problem List   Diagnosis Date Noted  . Chronic maxillary sinusitis 06/10/2017  . Cephalalgia 04/12/2014  . Depression 04/12/2014  . Sacroiliac dysfunction 02/02/2012  . Fibromyalgia 11/30/2011    Past Surgical History:  Procedure Laterality Date  . CESAREAN SECTION      OB History   None      Home Medications    Prior to Admission medications   Medication Sig Start Date End Date Taking? Authorizing Provider  albuterol (PROVENTIL HFA;VENTOLIN HFA) 108 (90 BASE) MCG/ACT inhaler Inhale 2 puffs into the lungs every 6 (six) hours as needed for wheezing or shortness of breath.    [provider]  amitriptyline (ELAVIL) 150 MG tablet Take 1 tablet (150 mg total) by mouth at bedtime. 04/12/14   Doris CheadleAdvani, Deepak, MD  aspirin-acetaminophen-caffeine (EXCEDRIN MIGRAINE) [phone] [id number] MG per tablet Take 2 tablets by mouth daily.    [provider]  gabapentin (NEURONTIN) 300 MG capsule Take 1 capsule (300 mg total) by mouth 3 (three) times daily. 04/12/14   Doris CheadleAdvani, Deepak, MD  naproxen (NAPROSYN) 500 MG tablet Take 1 tablet (500 mg total) by mouth 2 (two) times daily. 11/21/14   Marlon PelGreene, Tiffany, PA-C  ondansetron (ZOFRAN) 8 MG tablet Take 1 tablet (8 mg total) by mouth every 8 (eight) hours as needed for nausea or vomiting. 06/16/16   Amyot, Ali LoweAnn Berry, NP  Vitamin D, Ergocalciferol, (DRISDOL) 50000 UNITS CAPS capsule Take 1 capsule (50,000 Units total) by  mouth every 7 (seven) days. 04/19/14   Doris CheadleAdvani, Deepak, MD    Family History Family History  Problem Relation Age of Onset  . COPD Mother   . Mental illness Mother   . Asthma Mother   . COPD Father   . Hypertension Father   . Cancer Paternal Uncle        lung cancer     Social History Social History   Tobacco Use  . Smoking status: Former Smoker    Packs/day: 0.50    Years: 20.00    Pack years: 10.00    Types: Cigarettes  . Smokeless tobacco: Never Used  . Tobacco comment: electronic cigarette  Substance Use Topics  . Alcohol use: No    Alcohol/week: 0.0 oz  . Drug use: No     Allergies   Latex and Penicillins   Review of Systems Review of Systems  HENT: Positive for congestion and ear pain.   Respiratory: Positive for cough.   Cardiovascular: Negative.   Gastrointestinal: Negative.      Physical Exam Triage Vital Signs ED Triage Vitals [06/10/17 1522]  Enc Vitals Group     BP 112/71     Pulse Rate 92     Resp 18     Temp 97.9 F (36.6 C)     Temp src      SpO2 100 %  Weight      Height      Head Circumference      Peak Flow      Pain Score 5     Pain Loc      Pain Edu?      Excl. in GC?    No data found.  Updated Vital Signs BP 112/71   Pulse 92   Temp 97.9 F (36.6 C)   Resp 18   SpO2 100%   Visual Acuity Right Eye Distance:   Left Eye Distance:   Bilateral Distance:    Right Eye Near:   Left Eye Near:    Bilateral Near:     Physical Exam  Constitutional: She appears well-developed and well-nourished.  HENT:  Head: Normocephalic.  Right external ear has a bit of retained Q-tip There is tenderness to percussion over sinuses  Eyes: Conjunctivae are normal.  Cardiovascular: Normal rate and regular rhythm.  Pulmonary/Chest: Effort normal and breath sounds normal.     UC Treatments / Results  Labs (all labs ordered are listed, but only abnormal results are displayed) Labs Reviewed - No data to  display  EKG None Radiology No results found.  Procedures Procedures (including critical care time)  Medications Ordered in UC Medications - No data to display   Initial Impression / Assessment and Plan / UC Course  I have reviewed the triage vital signs and the nursing notes.  Pertinent labs & imaging results that were available during my care of the patient were reviewed by me and considered in my medical decision making (see chart for details).     Sinusitis.  Continue Nasonex but hold off on antihistamine  Final Clinical Impressions(s) / UC Diagnoses   Final diagnoses:  Chronic maxillary sinusitis    ED Discharge Orders    None       Controlled Substance Prescriptions [place] Controlled Substance Registry consulted? No   Frederica Kuster, MD 06/10/17 1540

## 2017-11-17 ENCOUNTER — Other Ambulatory Visit: Payer: Self-pay

## 2017-11-17 ENCOUNTER — Ambulatory Visit (INDEPENDENT_AMBULATORY_CARE_PROVIDER_SITE_OTHER): Payer: Self-pay

## 2017-11-17 ENCOUNTER — Encounter (HOSPITAL_COMMUNITY): Payer: Self-pay | Admitting: Emergency Medicine

## 2017-11-17 ENCOUNTER — Ambulatory Visit (HOSPITAL_COMMUNITY)
Admission: EM | Admit: 2017-11-17 | Discharge: 2017-11-17 | Disposition: A | Discharge status: Home / Self Care | Payer: Self-pay | Attending: Family Medicine | Admitting: Family Medicine

## 2017-11-17 DIAGNOSIS — M25512 Pain in left shoulder: Secondary | ICD-10-CM

## 2017-11-17 DIAGNOSIS — S161XXA Strain of muscle, fascia and tendon at neck level, initial encounter: Secondary | ICD-10-CM

## 2017-11-17 MED ORDER — NAPROXEN 500 MG PO TABS: 500.0000 mg | ORAL_TABLET | Freq: Two times a day (BID) | ORAL | 0 refills | Status: DC
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
## 2017-11-17 NOTE — Discharge Instructions (Addendum)
Use anti-inflammatories for pain/swelling. You may take up to 800 mg Ibuprofen every 8 hours OR Naprosyn every 12 hours with food. You may supplement Ibuprofen with Tylenol [phone] mg every 8 hours.   Ice and heating pad  May use muscle relaxer at bedtime  Pain may worsen over next 2 days, follow up if pain not improving as expected over next 2 weeks, developing headache, vision changes, nausea, vomiting, shortness of breath or chest discomfort, weakness
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
## 2017-11-22 ENCOUNTER — Encounter (INDEPENDENT_AMBULATORY_CARE_PROVIDER_SITE_OTHER): Payer: Self-pay | Admitting: Family Medicine

## 2017-11-22 ENCOUNTER — Ambulatory Visit (INDEPENDENT_AMBULATORY_CARE_PROVIDER_SITE_OTHER): Payer: PRIVATE HEALTH INSURANCE | Admitting: Family Medicine

## 2017-11-22 ENCOUNTER — Ambulatory Visit (INDEPENDENT_AMBULATORY_CARE_PROVIDER_SITE_OTHER): Payer: PRIVATE HEALTH INSURANCE

## 2017-11-22 DIAGNOSIS — M25512 Pain in left shoulder: Secondary | ICD-10-CM

## 2017-11-22 DIAGNOSIS — M542 Cervicalgia: Secondary | ICD-10-CM

## 2017-11-22 DIAGNOSIS — M797 Fibromyalgia: Secondary | ICD-10-CM | POA: Diagnosis not present

## 2017-11-22 MED ORDER — VITAMIN D-3 125 MCG (5000 UT) PO TABS: 1.0000 | ORAL_TABLET | Freq: Every day | ORAL | 3 refills | Status: DC

## 2017-11-22 MED ORDER — BACLOFEN 10 MG PO TABS: 10.0000 mg | ORAL_TABLET | Freq: Three times a day (TID) | ORAL | 1 refills | Status: DC | PRN
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
## 2017-11-23 ENCOUNTER — Telehealth (INDEPENDENT_AMBULATORY_CARE_PROVIDER_SITE_OTHER): Payer: Self-pay | Admitting: Family Medicine

## 2017-11-23 NOTE — Telephone Encounter (Signed)
Order for therapy faxed to [facility] [phone]/Alisha ph [phone]

## 2018-05-07 ENCOUNTER — Other Ambulatory Visit: Payer: Self-pay

## 2018-05-07 ENCOUNTER — Ambulatory Visit (HOSPITAL_COMMUNITY)
Admission: EM | Admit: 2018-05-07 | Discharge: 2018-05-07 | Disposition: A | Discharge status: Home / Self Care | Payer: PRIVATE HEALTH INSURANCE | Attending: Family Medicine | Admitting: Family Medicine

## 2018-05-07 ENCOUNTER — Encounter (HOSPITAL_COMMUNITY): Payer: Self-pay

## 2018-05-07 DIAGNOSIS — R197 Diarrhea, unspecified: Secondary | ICD-10-CM

## 2018-05-07 DIAGNOSIS — R112 Nausea with vomiting, unspecified: Secondary | ICD-10-CM

## 2018-05-07 DIAGNOSIS — B349 Viral infection, unspecified: Secondary | ICD-10-CM

## 2018-05-07 MED ORDER — DIPHENOXYLATE-ATROPINE 2.5-0.025 MG PO TABS: 1.0000 | ORAL_TABLET | Freq: Three times a day (TID) | ORAL | 0 refills | Status: DC | PRN

## 2018-05-07 MED ORDER — ONDANSETRON 4 MG PO TBDP: 4.0000 mg | ORAL_TABLET | Freq: Three times a day (TID) | ORAL | 0 refills | Status: DC | PRN
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
## 2018-05-07 NOTE — ED Provider Notes (Signed)
[facility]    CSN: [id number] Arrival date & time: 05/07/18  1131     History   Chief Complaint Chief Complaint  Patient presents with  . Diarrhea    HPI Monica Vance is a 38 y.o. female.   HPI  Patient concern for coronavirus. No direct exposure. 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  Diarrhea and nausea/vomiting x started on Thursday. She has taken zantac, cetrizine, flexeril, ibuprofen and tramadol. Loose stools with food intake and reports 12 stools over the last 24 hours.Wednesday was her last full meal. She is presently sipping on mountain dew and tolerating.  Past Medical History:  Diagnosis Date  . Anxiety   . Arthritis   . Depression   . Fibromyalgia   . Hypertension     Patient Active Problem List   Diagnosis Date Noted  . Chronic maxillary sinusitis 06/10/2017  . Cephalalgia 04/12/2014  . Depression 04/12/2014  . Sacroiliac dysfunction 02/02/2012  . Fibromyalgia 11/30/2011    Past Surgical History:  Procedure Laterality Date  . CESAREAN SECTION      OB History   No obstetric history on file.      Home Medications    Prior to Admission medications   Medication Sig Start Date End Date Taking? Authorizing Provider  albuterol (PROVENTIL HFA;VENTOLIN HFA) 108 (90 BASE) MCG/ACT inhaler Inhale 2 puffs into the lungs every 6 (six) hours as needed for wheezing or shortness of breath.    [provider]  amitriptyline (ELAVIL) 150 MG tablet Take 1 tablet (150 mg total) by mouth at bedtime. 04/12/14   Doris Cheadle, MD  aspirin-acetaminophen-caffeine (EXCEDRIN MIGRAINE) [phone] MG per tablet Take 2 tablets by mouth daily.    [provider]  baclofen (LIORESAL) 10 MG tablet Take 1 tablet (10 mg total) by mouth 3 (three) times daily as needed for muscle spasms. 11/22/17   Hilts, Casimiro Needle, MD  Cholecalciferol (VITAMIN D-3) 5000 units TABS Take 1 tablet by mouth daily.  11/22/17   Hilts, Casimiro Needle, MD  doxycycline (VIBRAMYCIN) 100 MG capsule Take 1 capsule (100 mg total) by mouth 2 (two) times daily. 06/10/17   Frederica Kuster, MD  gabapentin (NEURONTIN) 300 MG capsule Take 1 capsule (300 mg total) by mouth 3 (three) times daily. 04/12/14   Doris Cheadle, MD  naproxen (NAPROSYN) 500 MG tablet Take 1 tablet (500 mg total) by mouth 2 (two) times daily. 11/17/17   Wieters, Hallie C, PA-C  ondansetron (ZOFRAN) 8 MG tablet Take 1 tablet (8 mg total) by mouth every 8 (eight) hours as needed for nausea or vomiting. 06/16/16   Amyot, Ali Lowe, NP  tiZANidine (ZANAFLEX) 4 MG tablet Take by mouth. 10/28/17   [provider]  traMADol (ULTRAM) 50 MG tablet Take by mouth. 10/28/17   [provider]  Vitamin D, Ergocalciferol, (DRISDOL) 50000 UNITS CAPS capsule Take 1 capsule (50,000 Units total) by mouth every 7 (seven) days. 04/19/14   Doris Cheadle, MD    Family History Family History  Problem Relation Age of Onset  . COPD Mother   . Mental illness Mother   . Asthma Mother   . COPD Father   . Hypertension Father   . Cancer Paternal Uncle        lung cancer     Social History Social History   Tobacco Use  . Smoking status: Former Smoker    Packs/day: 0.50    Years: 20.00  Pack years: 10.00    Types: Cigarettes  . Smokeless tobacco: Never Used  . Tobacco comment: electronic cigarette  Substance Use Topics  . Alcohol use: No    Alcohol/week: 0.0 standard drinks  . Drug use: No     Allergies   Latex and Penicillins   Review of Systems Review of Systems Pertinent negatives listed in HPI Physical Exam Triage Vital Signs ED Triage Vitals  Enc Vitals Group     BP 05/07/18 1231 (!) 158/91     Pulse Rate 05/07/18 1231 76     Resp 05/07/18 1231 18     Temp 05/07/18 1231 97.8 F (36.6 C)     Temp Source 05/07/18 1231 Oral     SpO2 05/07/18 1231 100 %     Weight 05/07/18 1232 210 lb (95.3 kg)     Height --      Head Circumference --       Peak Flow --      Pain Score 05/07/18 1232 7     Pain Loc --      Pain Edu? --      Excl. in [place]? --    No data found.  Updated Vital Signs BP (!) 158/91 (BP Location: Right Arm)   Pulse 76   Temp 97.8 F (36.6 C) (Oral)   Resp 18   Wt 210 lb (95.3 kg)   LMP 05/02/2018   SpO2 100%   BMI 37.20 kg/m   Visual Acuity Right Eye Distance:   Left Eye Distance:   Bilateral Distance:    Right Eye Near:   Left Eye Near:    Bilateral Near:     Physical Exam General appearance: alert, morbidly obese, no distress, non-ill-appearing Head: Normocephalic, without obvious abnormality, atraumatic Respiratory: Respirations even and unlabored, normal respiratory rate Heart: rate and rhythm normal. No gallop or murmurs noted on exam  Abdomen: refused abdominal exam-unable to lay back  Extremities: No gross deformities Skin: Skin color, texture, turgor normal. No rashes seen  Psych: Appropriate mood and affect. Neurologic: Mental status: Alert, oriented to person, place, and time, thought content appropriate. UC Treatments / Results  Labs (all labs ordered are listed, but only abnormal results are displayed) Labs Reviewed - No data to display  EKG None  Radiology No results found.  Procedures Procedures (including critical care time)  Medications Ordered in UC Medications - No data to display  Initial Impression / Assessment and Plan / UC Course  I have reviewed the triage vital signs and the nursing notes.  Pertinent labs & imaging results that were available during my care of the patient were reviewed by me and considered in my medical decision making (see chart for details).    Patient presents with symptoms consistent with a viral GI illness.  No recent sick contacts at home or at work.  She has remained afebrile and has been able to go to work daily in spite of diarrhea nausea symptoms.  Will treat symptomatically for now.  Patient appears stable.  She was also  provided a work note to stay at home on Monday if symptoms have not completely resolved.  Patient verbalized understanding and agreement with plan. Final Clinical Impressions(s) / UC Diagnoses   Final diagnoses:  Diarrhea in adult patient  Nausea vomiting and diarrhea  Viral illness     Discharge Instructions     Return for follow-up if symptoms worsen or do not improve.    ED Prescriptions  Medication Sig Dispense Auth. Provider   ondansetron (ZOFRAN ODT) 4 MG disintegrating tablet Take 1 tablet (4 mg total) by mouth every 8 (eight) hours as needed for nausea or vomiting. 20 tablet Bing Neighbors, FNP   diphenoxylate-atropine (LOMOTIL) 2.5-0.025 MG tablet Take 1 tablet by mouth 3 (three) times daily as needed for diarrhea or loose stools (diarrhea). 16 tablet Bing Neighbors, FNP     Controlled Substance Prescriptions [place] Controlled Substance Registry consulted? Not Applicable   Pablo, Ferko, FNP 05/07/18 1318

## 2018-05-07 NOTE — Discharge Instructions (Addendum)
Return for follow-up if symptoms worsen or do not improve.

## 2021-06-25 ENCOUNTER — Ambulatory Visit (HOSPITAL_COMMUNITY)
Admission: RE | Admit: 2021-06-25 | Discharge: 2021-06-25 | Disposition: A | Discharge status: Home / Self Care | Payer: PRIVATE HEALTH INSURANCE | Source: Ambulatory Visit | Attending: Emergency Medicine | Admitting: Emergency Medicine

## 2021-06-25 ENCOUNTER — Encounter (HOSPITAL_COMMUNITY): Payer: Self-pay

## 2021-06-25 VITALS — BP 138/96 | HR 66 | Temp 97.9°F | Resp 18

## 2021-06-25 DIAGNOSIS — R350 Frequency of micturition: Secondary | ICD-10-CM | POA: Diagnosis present

## 2021-06-25 DIAGNOSIS — R300 Dysuria: Secondary | ICD-10-CM | POA: Insufficient documentation

## 2021-06-25 LAB — POCT URINALYSIS DIPSTICK, ED / UC
Bilirubin Urine: NEGATIVE
Glucose, UA: NEGATIVE mg/dL
Hgb urine dipstick: NEGATIVE
Ketones, ur: NEGATIVE mg/dL
Leukocytes,Ua: NEGATIVE
Nitrite: NEGATIVE
Protein, ur: NEGATIVE mg/dL
Specific Gravity, Urine: 1.015 (ref 1.005–1.030)
Urobilinogen, UA: 0.2 mg/dL (ref 0.0–1.0)
pH: 5 (ref 5.0–8.0)

## 2021-06-25 MED ORDER — FLUCONAZOLE 150 MG PO TABS: 150.0000 mg | ORAL_TABLET | Freq: Every day | ORAL | 0 refills | Status: AC

## 2021-06-25 NOTE — ED Provider Notes (Signed)
?[facility] ? ? ? ?CSN: [id number] ?Arrival date & time: 06/25/21  1803 ? ? ?  ? ?History   ?Chief Complaint ?Chief Complaint  ?Patient presents with  ? appt 630  ? Back Pain  ? Nasal Congestion  ? ? ?HPI ?Monica Vance is a 41 y.o. female.  ? ?Patient presents with urinary frequency, weakened urine stream, dysuria, bilateral flank pain and lower abdominal pressure for 5 days.  Has attempted use of Uristat, water and cranberry which has been ineffective.  Denies fever, chills, vaginal discharge, itching, odor or new rash or lesions.  Denies sexual activity.  ? ? ?Past Medical History:  ?Diagnosis Date  ? Anxiety   ? Arthritis   ? Depression   ? Fibromyalgia   ? Hypertension   ? ? ?Patient Active Problem List  ? Diagnosis Date Noted  ? Chronic maxillary sinusitis 06/10/2017  ? Cephalalgia 04/12/2014  ? Depression 04/12/2014  ? Sacroiliac dysfunction 02/02/2012  ? Fibromyalgia 11/30/2011  ? ? ?Past Surgical History:  ?Procedure Laterality Date  ? CESAREAN SECTION    ? ? ?OB History   ?No obstetric history on file. ?  ? ? ? ?Home Medications   ? ?Prior to Admission medications   ?Medication Sig Start Date End Date Taking? Authorizing Provider  ?albuterol (PROVENTIL HFA;VENTOLIN HFA) 108 (90 BASE) MCG/ACT inhaler Inhale 2 puffs into the lungs every 6 (six) hours as needed for wheezing or shortness of breath.    [provider]  ?amitriptyline (ELAVIL) 150 MG tablet Take 1 tablet (150 mg total) by mouth at bedtime. 04/12/14   Doris Cheadle, MD  ?aspirin-acetaminophen-caffeine (EXCEDRIN MIGRAINE) [phone] MG per tablet Take 2 tablets by mouth daily.    [provider]  ?baclofen (LIORESAL) 10 MG tablet Take 1 tablet (10 mg total) by mouth 3 (three) times daily as needed for muscle spasms. 11/22/17   Hilts, Casimiro Needle, MD  ?Cholecalciferol (VITAMIN D-3) 5000 units TABS Take 1 tablet by mouth daily. 11/22/17   Hilts, Casimiro Needle, MD  ?diphenoxylate-atropine (LOMOTIL) 2.5-0.025 MG tablet Take 1  tablet by mouth 3 (three) times daily as needed for diarrhea or loose stools (diarrhea). 05/07/18   Bing Neighbors, FNP  ?doxycycline (VIBRAMYCIN) 100 MG capsule Take 1 capsule (100 mg total) by mouth 2 (two) times daily. 06/10/17   Frederica Kuster, MD  ?gabapentin (NEURONTIN) 300 MG capsule Take 1 capsule (300 mg total) by mouth 3 (three) times daily. 04/12/14   Doris Cheadle, MD  ?naproxen (NAPROSYN) 500 MG tablet Take 1 tablet (500 mg total) by mouth 2 (two) times daily. 11/17/17   Wieters, Hallie C, PA-C  ?ondansetron (ZOFRAN ODT) 4 MG disintegrating tablet Take 1 tablet (4 mg total) by mouth every 8 (eight) hours as needed for nausea or vomiting. 05/07/18   Bing Neighbors, FNP  ?ondansetron (ZOFRAN) 8 MG tablet Take 1 tablet (8 mg total) by mouth every 8 (eight) hours as needed for nausea or vomiting. 06/16/16   Amyot, Ali Lowe, NP  ?tiZANidine (ZANAFLEX) 4 MG tablet Take by mouth. 10/28/17   [provider]  ?traMADol (ULTRAM) 50 MG tablet Take by mouth. 10/28/17   [provider]  ?Vitamin D, Ergocalciferol, (DRISDOL) 50000 UNITS CAPS capsule Take 1 capsule (50,000 Units total) by mouth every 7 (seven) days. 04/19/14   Doris Cheadle, MD  ? ? ?Family History ?Family History  ?Problem Relation Age of Onset  ? COPD Mother   ? Mental illness Mother   ? Asthma  Mother   ? COPD Father   ? Hypertension Father   ? Cancer Paternal Uncle   ?     lung cancer   ? ? ?Social History ?Social History  ? ?Tobacco Use  ? Smoking status: Former  ?  Packs/day: 0.50  ?  Years: 20.00  ?  Pack years: 10.00  ?  Types: Cigarettes  ? Smokeless tobacco: Never  ? Tobacco comments:  ?  electronic cigarette  ?Substance Use Topics  ? Alcohol use: No  ?  Alcohol/week: 0.0 standard drinks  ? Drug use: No  ? ? ? ?Allergies   ?Latex and Penicillins ? ? ?Review of Systems ?Review of Systems  ?Constitutional: Negative.   ?Respiratory: Negative.    ?Cardiovascular: Negative.   ?Genitourinary:  Positive for difficulty urinating,  dysuria, flank pain, frequency and pelvic pain. Negative for decreased urine volume, dyspareunia, enuresis, genital sores, hematuria, menstrual problem, urgency, vaginal bleeding, vaginal discharge and vaginal pain.  ?Skin: Negative.   ? ? ?Physical Exam ?Triage Vital Signs ?ED Triage Vitals  ?Enc Vitals Group  ?   BP 06/25/21 1853 (!) 138/96  ?   Pulse Rate 06/25/21 1853 66  ?   Resp 06/25/21 1853 18  ?   Temp 06/25/21 1853 97.9 ?F (36.6 ?C)  ?   Temp Source 06/25/21 1853 Oral  ?   SpO2 06/25/21 1853 96 %  ?   Weight --   ?   Height --   ?   Head Circumference --   ?   Peak Flow --   ?   Pain Score 06/25/21 1851 7  ?   Pain Loc --   ?   Pain Edu? --   ?   Excl. in GC? --   ? ?No data found. ? ?Updated Vital Signs ?BP (!) 138/96 (BP Location: Right Arm)   Pulse 66   Temp 97.9 ?F (36.6 ?C) (Oral)   Resp 18   SpO2 96%  ? ?Visual Acuity ?Right Eye Distance:   ?Left Eye Distance:   ?Bilateral Distance:   ? ?Right Eye Near:   ?Left Eye Near:    ?Bilateral Near:    ? ?Physical Exam ?Constitutional:   ?   Appearance: Normal appearance.  ?HENT:  ?   Head: Normocephalic.  ?Eyes:  ?   Extraocular Movements: Extraocular movements intact.  ?Pulmonary:  ?   Effort: Pulmonary effort is normal.  ?Abdominal:  ?   General: Abdomen is flat. Bowel sounds are normal.  ?   Palpations: Abdomen is soft.  ?   Tenderness: There is abdominal tenderness.  ?Skin: ?   General: Skin is warm and dry.  ?Neurological:  ?   Mental Status: She is alert and oriented to person, place, and time. Mental status is at baseline.  ?Psychiatric:     ?   Mood and Affect: Mood normal.     ?   Behavior: Behavior normal.  ? ? ? ?UC Treatments / Results  ?Labs ?(all labs ordered are listed, but only abnormal results are displayed) ?Labs Reviewed  ?POCT URINALYSIS DIPSTICK, ED / UC  ? ? ?EKG ? ? ?Radiology ?No results found. ? ?Procedures ?Procedures (including critical care time) ? ?Medications Ordered in UC ?Medications - No data to display ? ?Initial  Impression / Assessment and Plan / UC Course  ?I have reviewed the triage vital signs and the nursing notes. ? ?Pertinent labs & imaging results that were available during my care of the  patient were reviewed by me and considered in my medical decision making (see chart for details). ? ?Urinary frequency ?Dysuria ? ?Urinalysis negative, vaginal swab checking for yeast and BV pending, will prophylactically treat for yeast, Diflucan prescribed, discussed administration, recommended continued use of statin if within the 2-day window, recommended continued increase fluid intake, advise good feminine hygiene such as wiping from front to back, avoidance of scented products and cotton underwear, follow-up with urgent care as needed ?Final Clinical Imprions(s) / UC Diagnoses  ? ?Final diagnoses:  ?None  ? ?Discharge Instructions   ?None ?  ? ?ED Prescriptions   ?None ?  ? ?PDMP not reviewed this encounter. ?  ?Valinda HoarWhite, Cambree Hendrix R, NP ?06/25/21 1933 ? ?

## 2021-06-25 NOTE — Discharge Instructions (Signed)
Urinalysis was negative for signs of infection ? ?Therefore we will move forward with testing for yeast and bacterial vaginosis which may cause similar symptoms ? ?We will prophylactically treat for yeast today, take 1 Diflucan tablet then in 3 days if symptoms are present you may take second dose ? ?Vaginal swab is pending 2 to 3 days, you will be notified of any positive test results, if positive for bacterial vaginosis medication will be sent at time of notification ? ?Continue increase fluid intake through use of water and cranberry juice ? ?And as always practice good feminine hygiene, wiping from front to back and avoidance of scented vaginal products, it may be beneficial to wear cotton underwear until symptoms have resolved as they are more breathable ? ?If symptoms continue to persist after use of medication or recur please follow-up with urgent care or your primary doctor as needed ? ?

## 2021-06-25 NOTE — ED Triage Notes (Signed)
Pt reports having pain in private and back area since Friday. Reports feels has to pee but can't Reports using OTC UTI medications but not helping.  ?Reports congestion started on Monday.  ?

## 2021-06-26 LAB — CERVICOVAGINAL ANCILLARY ONLY
Bacterial Vaginitis (gardnerella): NEGATIVE
Candida Glabrata: NEGATIVE
Candida Vaginitis: NEGATIVE
Comment: NEGATIVE
Comment: NEGATIVE
Comment: NEGATIVE

## 2021-10-16 ENCOUNTER — Ambulatory Visit (HOSPITAL_COMMUNITY)
Admission: RE | Admit: 2021-10-16 | Discharge: 2021-10-16 | Disposition: A | Discharge status: Home / Self Care | Payer: PRIVATE HEALTH INSURANCE | Source: Ambulatory Visit | Attending: Internal Medicine | Admitting: Internal Medicine

## 2021-10-16 ENCOUNTER — Encounter (HOSPITAL_COMMUNITY): Payer: Self-pay

## 2021-10-16 ENCOUNTER — Ambulatory Visit (INDEPENDENT_AMBULATORY_CARE_PROVIDER_SITE_OTHER): Payer: PRIVATE HEALTH INSURANCE

## 2021-10-16 VITALS — BP 128/69 | HR 65 | Temp 97.7°F | Resp 17

## 2021-10-16 DIAGNOSIS — M79632 Pain in left forearm: Secondary | ICD-10-CM

## 2021-10-16 DIAGNOSIS — S5400XA Injury of ulnar nerve at forearm level, unspecified arm, initial encounter: Secondary | ICD-10-CM

## 2021-10-16 NOTE — ED Triage Notes (Signed)
Pt reports left arm pain. States she was running and tripped and hit her left forearm into a door frame yesterday, States able to move arm but unable to lift anything. Reports feeling numbness from left fingertips to left shoulder.  Rates pain 8/10.

## 2021-10-16 NOTE — Discharge Instructions (Signed)
Rest and elevate the affected painful area.   Apply cold compresses intermittently as needed. Please take ibuprofen or Tylenol as needed for pain As pain recedes, begin normal activities slowly as tolerated.   Return to urgent care if symptoms worsen or if you have any other concerns.

## 2022-05-27 ENCOUNTER — Ambulatory Visit (HOSPITAL_COMMUNITY)
Admission: RE | Admit: 2022-05-27 | Discharge: 2022-05-27 | Disposition: A | Discharge status: Home / Self Care | Payer: PRIVATE HEALTH INSURANCE | Source: Ambulatory Visit | Attending: Nurse Practitioner | Admitting: Nurse Practitioner

## 2022-05-27 ENCOUNTER — Encounter (HOSPITAL_COMMUNITY): Payer: Self-pay

## 2022-05-27 VITALS — BP 143/97 | HR 79 | Temp 99.1°F | Resp 16

## 2022-05-27 DIAGNOSIS — K0889 Other specified disorders of teeth and supporting structures: Secondary | ICD-10-CM

## 2022-05-27 DIAGNOSIS — K047 Periapical abscess without sinus: Secondary | ICD-10-CM

## 2022-05-27 MED ORDER — CHLORHEXIDINE GLUCONATE 0.12 % MT SOLN: 15.0000 mL | Freq: Three times a day (TID) | OROMUCOSAL | 0 refills | Status: DC

## 2022-05-27 MED ORDER — CLINDAMYCIN HCL 300 MG PO CAPS: 300.0000 mg | ORAL_CAPSULE | Freq: Three times a day (TID) | ORAL | 0 refills | Status: AC

## 2022-05-27 MED ORDER — IBUPROFEN 800 MG PO TABS: 800.0000 mg | ORAL_TABLET | Freq: Three times a day (TID) | ORAL | 0 refills | Status: DC | PRN

## 2022-05-27 MED ORDER — OXYCODONE-ACETAMINOPHEN 5-325 MG PO TABS: 1.0000 | ORAL_TABLET | ORAL | 0 refills | Status: DC | PRN

## 2022-05-27 NOTE — ED Triage Notes (Signed)
Pt is here for dental pain , ear pain and throat pain x 1day . Pt has been taking ibuprofen and tylenol nothing is helping

## 2022-05-27 NOTE — ED Provider Notes (Signed)
[facility]    CSN: [id number] Arrival date & time: 05/27/22  1825      History   Chief Complaint Chief Complaint  Patient presents with   Dental Pain    HPI Monica Vance is a 42 y.o. female.    Monica Vance is a 42 y.o. female that presents with complaint of toothache.  Patient reports that she cracked a tooth months ago and have not yet seen a dentist.  The pain started abrupt starting 2 days ago. Patient describes pain as severe aching and throbbing. Pain severity now is 10 /10. The pain radiates into the left ear and left side of throat. Patient denies jaw swelling or fever >101. Pain is aggravated by movement, use, and palpation. Pain is alleviated by nothing. The patient denies other complaints. Patient has not sought treatment by another care provider for this problem. Care prior to arrival consisted of acetaminophen, ibuprofen and orajel with no relief.         Past Medical History:  Diagnosis Date   Anxiety    Arthritis    Depression    Fibromyalgia    Hypertension     Patient Active Problem List   Diagnosis Date Noted   Chronic maxillary sinusitis 06/10/2017   Cephalalgia 04/12/2014   Depression 04/12/2014   Sacroiliac dysfunction 02/02/2012   Fibromyalgia 11/30/2011    Past Surgical History:  Procedure Laterality Date   CESAREAN SECTION      OB History   No obstetric history on file.      Home Medications    Prior to Admission medications   Medication Sig Start Date End Date Taking? Authorizing Provider  chlorhexidine (PERIDEX) 0.12 % solution 15 mLs by Mouth Rinse route 4 (four) times daily - after meals and at bedtime. Swish with 15 mLs of solution in the mouth then spit out after meals and at bedtime 05/27/22  Yes Thelton Graca, Aldona Bar, FNP  clindamycin (CLEOCIN) 300 MG capsule Take 1 capsule (300 mg total) by mouth 3 (three) times daily for 10 days. 05/27/22 06/06/22 Yes Enrique Sack, FNP  ibuprofen (ADVIL) 800 MG tablet  Take 1 tablet (800 mg total) by mouth every 8 (eight) hours as needed for mild pain or moderate pain. 05/27/22  Yes Enrique Sack, FNP  oxyCODONE-acetaminophen (PERCOCET/ROXICET) 5-325 MG tablet Take 1 tablet by mouth every 4 (four) hours as needed for severe pain. 05/27/22  Yes Enrique Sack, FNP    Family History Family History  Problem Relation Age of Onset   COPD Mother    Mental illness Mother    Asthma Mother    COPD Father    Hypertension Father    Cancer Paternal Uncle        lung cancer     Social History Social History   Tobacco Use   Smoking status: Former    Packs/day: 0.50    Years: 20.00    Additional pack years: 0.00    Total pack years: 10.00    Types: Cigarettes   Smokeless tobacco: Never   Tobacco comments:    electronic cigarette  Substance Use Topics   Alcohol use: No    Alcohol/week: 0.0 standard drinks of alcohol   Drug use: No     Allergies   Latex and Penicillins   Review of Systems Review of Systems  Constitutional:  Negative for fever.  HENT:  Positive for dental problem.   Gastrointestinal:  Negative for nausea and vomiting.     Physical  Exam Triage Vital Signs ED Triage Vitals  Enc Vitals Group     BP 05/27/22 1901 (!) 143/97     Pulse Rate 05/27/22 1901 79     Resp 05/27/22 1901 16     Temp 05/27/22 1901 99.1 F (37.3 C)     Temp Source 05/27/22 1901 Oral     SpO2 05/27/22 1901 98 %     Weight --      Height --      Head Circumference --      Peak Flow --      Pain Score 05/27/22 1907 10     Pain Loc --      Pain Edu? --      Excl. in [place]? --    No data found.  Updated Vital Signs BP (!) 143/97 (BP Location: Right Arm)   Pulse 79   Temp 99.1 F (37.3 C) (Oral)   Resp 16   SpO2 98%   Visual Acuity Right Eye Distance:   Left Eye Distance:   Bilateral Distance:    Right Eye Near:   Left Eye Near:    Bilateral Near:     Physical Exam Vitals reviewed.  Constitutional:      General: She is not in acute  distress.    Appearance: Normal appearance. She is not ill-appearing, toxic-appearing or diaphoretic.     Comments: Uncomfortable   HENT:     Mouth/Throat:     Lips: Pink.     Mouth: Mucous membranes are moist.     Dentition: Dental tenderness, gingival swelling and dental caries present. No dental abscesses.     Tongue: No lesions.     Pharynx: Oropharynx is clear. Uvula midline. No pharyngeal swelling, oropharyngeal exudate, posterior oropharyngeal erythema or uvula swelling.   Eyes:     Conjunctiva/sclera: Conjunctivae normal.  Cardiovascular:     Rate and Rhythm: Normal rate.  Pulmonary:     Effort: Pulmonary effort is normal.  Musculoskeletal:        General: Normal range of motion.     Cervical back: Normal range of motion and neck supple. No rigidity.  Lymphadenopathy:     Cervical: No cervical adenopathy.  Skin:    General: Skin is warm and dry.  Neurological:     General: No focal deficit present.     Mental Status: She is alert and oriented to person, place, and time.      UC Treatments / Results  Labs (all labs ordered are listed, but only abnormal results are displayed) Labs Reviewed - No data to display  EKG   Radiology No results found.  Procedures Procedures (including critical care time)  Medications Ordered in UC Medications - No data to display  Initial Impression / Assessment and Plan / UC Course  I have reviewed the triage vital signs and the nursing notes.  Pertinent labs & imaging results that were available during my care of the patient were reviewed by me and considered in my medical decision making (see chart for details).    42 yo female presenting with dental pain due to an infected broken molar.  Patient is uncomfortable but nontoxic.  Low-grade fever of 99.1.  Physical exam as above.  Clindamycin prescribed as patient has a penicillin allergy.  Peridex mouth rinses, Motrin and Percocet.  Supportive care.  Dentistry follow-up as soon  as possible.  Today's evaluation has revealed no signs of a dangerous process. Discussed diagnosis with patient and/or guardian. Patient  and/or guardian aware of their diagnosis, possible red flag symptoms to watch out for and need for close follow up. Patient and/or guardian understands verbal and written discharge instructions. Patient and/or guardian comfortable with plan and disposition.  Patient and/or guardian has a clear mental status at this time, good insight into illness (after discussion and teaching) and has clear judgment to make decisions regarding their care  Documentation was completed with the aid of voice recognition software. Transcription may contain typographical errors. Final Clinical Impressions(s) / UC Diagnoses   Final diagnoses:  Gore infection     Discharge Instructions      Your dental pain is likely due to an infected broken tooth.  Take antibiotics as prescribed.  Swish with the prescribed mouth rinse after meals and at bedtime  Brush and floss your teeth twice a day  Take medications for pain as needed  Eat soft foods  Follow-up with dentistry as soon as possible  Get immediate ED or dentistry evaluation if:  You are unable to open your mouth. You are having trouble breathing or swallowing. You have a fever. You notice that your face, neck, or jaw is swollen.     ED Prescriptions     Medication Sig Dispense Auth. Provider   clindamycin (CLEOCIN) 300 MG capsule Take 1 capsule (300 mg total) by mouth 3 (three) times daily for 10 days. 30 capsule Enrique Sack, FNP   oxyCODONE-acetaminophen (PERCOCET/ROXICET) 5-325 MG tablet Take 1 tablet by mouth every 4 (four) hours as needed for severe pain. 12 tablet Enrique Sack, FNP   chlorhexidine (PERIDEX) 0.12 % solution 15 mLs by Mouth Rinse route 4 (four) times daily - after meals and at bedtime. Swish with 15 mLs of solution in the mouth then spit out after meals and at bedtime 240 mL  Enrique Sack, FNP   ibuprofen (ADVIL) 800 MG tablet Take 1 tablet (800 mg total) by mouth every 8 (eight) hours as needed for mild pain or moderate pain. 21 tablet Enrique Sack, FNP      I have reviewed the PDMP during this encounter.   Enrique Sack, [place] 05/27/22 2003

## 2022-05-27 NOTE — Discharge Instructions (Addendum)
Your dental pain is likely due to an infected broken tooth.  Take antibiotics as prescribed.  Swish with the prescribed mouth rinse after meals and at bedtime  Brush and floss your teeth twice a day  Take medications for pain as needed  Eat soft foods  Follow-up with dentistry as soon as possible  Get immediate ED or dentistry evaluation if:  You are unable to open your mouth. You are having trouble breathing or swallowing. You have a fever. You notice that your face, neck, or jaw is swollen.

## 2022-10-03 ENCOUNTER — Ambulatory Visit (HOSPITAL_COMMUNITY): Payer: PRIVATE HEALTH INSURANCE

## 2023-07-01 ENCOUNTER — Ambulatory Visit (HOSPITAL_COMMUNITY): Payer: Self-pay

## 2023-09-12 ENCOUNTER — Ambulatory Visit (HOSPITAL_COMMUNITY): Payer: PRIVATE HEALTH INSURANCE

## 2023-09-14 ENCOUNTER — Encounter (HOSPITAL_COMMUNITY): Payer: Self-pay

## 2023-09-14 ENCOUNTER — Ambulatory Visit (HOSPITAL_COMMUNITY)
Admission: RE | Admit: 2023-09-14 | Discharge: 2023-09-14 | Disposition: A | Discharge status: Home / Self Care | Payer: PRIVATE HEALTH INSURANCE | Source: Ambulatory Visit

## 2023-09-14 VITALS — BP 128/91 | HR 79 | Temp 97.8°F | Resp 18

## 2023-09-14 DIAGNOSIS — R051 Acute cough: Secondary | ICD-10-CM | POA: Diagnosis not present

## 2023-09-14 DIAGNOSIS — R52 Pain, unspecified: Secondary | ICD-10-CM | POA: Diagnosis not present

## 2023-09-14 DIAGNOSIS — J320 Chronic maxillary sinusitis: Secondary | ICD-10-CM | POA: Diagnosis not present

## 2023-09-14 MED ORDER — DOXYCYCLINE HYCLATE 100 MG PO CAPS: 100.0000 mg | ORAL_CAPSULE | Freq: Two times a day (BID) | ORAL | 0 refills | Status: AC

## 2023-09-14 NOTE — ED Provider Notes (Signed)
 [facility]    CSN: [id number] Arrival date & time: 09/14/23  1854      History   Chief Complaint Chief Complaint  Patient presents with   appt- cough    HPI Monica Vance is a 43 y.o. female.   HPI Patient is a 44 year old female who presents to the urgent care today with concerns of sinus pressure, sinus pain, nasal congestion, cough, ear pain, body aches, chills, and pain in the chest with coughing excessively.  She reports her symptoms began a week and a half ago.  She reports that her 106-year-old was sick recently and she thinks that she picked up something from him.  She reports going through menopause currently so does not know if she has had any fevers or not has not checked her temperature.  She denies any chest pain (without coughing), shortness of breath, rash, diarrhea, or other concerns at this time. Past Medical History:  Diagnosis Date   Anxiety    Arthritis    Depression    Fibromyalgia    Hypertension     Patient Active Problem List   Diagnosis Date Noted   Chronic maxillary sinusitis 06/10/2017   Cephalalgia 04/12/2014   Depression 04/12/2014   Sacroiliac dysfunction 02/02/2012   Fibromyalgia 11/30/2011    Past Surgical History:  Procedure Laterality Date   CESAREAN SECTION      OB History   No obstetric history on file.      Home Medications    Prior to Admission medications   Medication Sig Start Date End Date Taking? Authorizing Provider  doxycycline  (VIBRAMYCIN ) 100 MG capsule Take 1 capsule (100 mg total) by mouth 2 (two) times daily for 7 days. 09/14/23 09/21/23 Yes Melonie Locus, PA-C    Family History Family History  Problem Relation Age of Onset   COPD Mother    Mental illness Mother    Asthma Mother    COPD Father    Hypertension Father    Cancer Paternal Uncle        lung cancer     Social History Social History   Tobacco Use   Smoking status: Former    Current packs/day: 0.50    Average packs/day:  0.5 packs/day for 20.0 years (10.0 ttl pk-yrs)    Types: Cigarettes   Smokeless tobacco: Never   Tobacco comments:    electronic cigarette  Substance Use Topics   Alcohol use: No    Alcohol/week: 0.0 standard drinks of alcohol   Drug use: No     Allergies   Latex and Penicillins   Review of Systems Review of Systems See HPI for relevant ROS.  Physical Exam Triage Vital Signs ED Triage Vitals [09/14/23 1915]  Encounter Vitals Group     BP (!) 128/91     Girls Systolic BP Percentile      Girls Diastolic BP Percentile      Boys Systolic BP Percentile      Boys Diastolic BP Percentile      Pulse Rate 79     Resp 18     Temp 97.8 F (36.6 C)     Temp Source Oral     SpO2 95 %     Weight      Height      Head Circumference      Peak Flow      Pain Score 10     Pain Loc      Pain Education  Exclude from Growth Chart    No data found.  Updated Vital Signs BP (!) 128/91 (BP Location: Right Arm)   Pulse 79   Temp 97.8 F (36.6 C) (Oral)   Resp 18   LMP 05/02/2018   SpO2 95%   Visual Acuity Right Eye Distance:   Left Eye Distance:   Bilateral Distance:    Right Eye Near:   Left Eye Near:    Bilateral Near:     Physical Exam General: Alert and oriented, well-developed/well-nourished, calm, cooperative, no acute distress HEENT: Normocephalic atraumatic, moist mucous membranes, no scleral icterus, trachea midline, TMs mildly erythematous without bulging, pharynx without erythema or tonsillar swelling or exudates Lungs: Speaking full sentences, non-labored respirations, no distress, clear to auscultation bilaterally Heart: Regular rate and rhythm Abdomen:  Soft, nondistended Musculoskeletal: Moves all extremities well Neurologic: Awake, A&O x4, gait normal Integumentary: Warm, dry, normal for ethnicity, intact, no rash Psychiatric: Appropriate mood & affect  UC Treatments / Results  Labs (all labs ordered are listed, but only abnormal results are  displayed) Labs Reviewed - No data to display  EKG   Radiology No results found.  Procedures Procedures (including critical care time)  Medications Ordered in UC Medications - No data to display  Initial Impression / Assessment and Plan / UC Course  I have reviewed the triage vital signs and the nursing notes.  Pertinent labs & imaging results that were available during my care of the patient were reviewed by me and considered in my medical decision making (see chart for details).    Presents with nasal congestion, sinus pressure, sinus pain, cough, sore throat, and ear pain.  Differential diagnosis includes: Viral sinusitis, bacterial sinusitis, URI, COVID, influenza, dental pain, allergies, migraine, cavernous sinus thrombosis, otitis media, pharyngitis, including other diagnoses.  History obtained from: Patient.  Plan: This patient presents with symptoms suspicious for likely sinusitis and upper respiratory infection. Do not suspect underlying cardiopulmonary or neurological process. I considered, but think unlikely, dangerous causes of this patient's symptoms to include dental infection or cavernous sinus thrombosis.  Patient is nontoxic appearing, afebrile, stable, and in no acute distress. Gave patient recommendations for over-the-counter medicines and remedies for symptomatic relief. As patient has signs and symptoms more concerning of bacterial sinusitis such as symptoms lasting more than 10 days, fever greater than 39 degrees more than 3 days, and/or double sickening type picture, prescribed patient doxycycline . Patient should follow-up with their primary care provider in the next several days.  Strict return precautions to the urgent care or emergency department were discussed including if symptoms worsen, if they have any extraocular pain, neurologic changes, swelling of the eyes/face, or if they have any other concerns.  Disposition: Stable to discharge home.   All  questions answered to the best of this examiner's ability. Advised to f/u with PCP for further eval and/or reassessment. Patient agrees to plan.  An appropriate evaluation has been performed, and in my medical judgment there is currently no evidence of an immediate life-threatening or surgical condition. Discharge is therefore indicated at this time.  This document was created using the aid of voice recognition Scientist, clinical (histocompatibility and immunogenetics).  Final Clinical Impressions(s) / UC Diagnoses   Final diagnoses:  Chronic maxillary sinusitis  Acute cough  Body aches     Discharge Instructions      We have sent in an antibiotic to help with your symptoms.  We recommend following up with your primary care provider if symptoms are not improving.  Please go to the emergency department if you develop any chest pain, shortness of breath, worsening of symptoms, or if you have any other concerns.    ED Prescriptions     Medication Sig Dispense Auth. Provider   doxycycline  (VIBRAMYCIN ) 100 MG capsule Take 1 capsule (100 mg total) by mouth 2 (two) times daily for 7 days. 14 capsule Melonie Locus, PA-C      PDMP not reviewed this encounter.   Melonie Locus, PA-C 09/14/23 1949

## 2023-09-14 NOTE — Discharge Instructions (Signed)
 We have sent in an antibiotic to help with your symptoms.  We recommend following up with your primary care provider if symptoms are not improving.  Please go to the emergency department if you develop any chest pain, shortness of breath, worsening of symptoms, or if you have any other concerns.

## 2023-09-14 NOTE — ED Triage Notes (Signed)
 Pt c/o cough, head/nasal/chest congestion, and bilateral ear pain x1.5wks. states used the nasal pot and OTC meds with no relief.

## 2023-12-17 ENCOUNTER — Ambulatory Visit (HOSPITAL_COMMUNITY): Payer: PRIVATE HEALTH INSURANCE

## 2023-12-17 ENCOUNTER — Ambulatory Visit (HOSPITAL_COMMUNITY)
Admission: RE | Admit: 2023-12-17 | Discharge: 2023-12-17 | Disposition: A | Discharge status: Home / Self Care | Payer: PRIVATE HEALTH INSURANCE | Source: Ambulatory Visit | Attending: Family Medicine | Admitting: Family Medicine

## 2023-12-17 ENCOUNTER — Encounter (HOSPITAL_COMMUNITY): Payer: Self-pay

## 2023-12-17 VITALS — BP 112/77 | HR 80 | Temp 97.8°F | Resp 18 | Ht 63.0 in | Wt 210.0 lb

## 2023-12-17 DIAGNOSIS — M79671 Pain in right foot: Secondary | ICD-10-CM

## 2023-12-17 MED ORDER — KETOROLAC TROMETHAMINE 30 MG/ML IJ SOLN
30.0000 mg | Freq: Once | INTRAMUSCULAR | Status: AC
  Administered 2023-12-17: 30 mg via INTRAMUSCULAR

## 2023-12-17 MED ORDER — KETOROLAC TROMETHAMINE 30 MG/ML IJ SOLN
INTRAMUSCULAR | Status: AC
  Filled 2023-12-17: qty 1

## 2023-12-17 MED ORDER — KETOROLAC TROMETHAMINE 10 MG PO TABS: 10.0000 mg | ORAL_TABLET | Freq: Four times a day (QID) | ORAL | 0 refills | Status: AC | PRN

## 2023-12-17 NOTE — ED Triage Notes (Signed)
 Right foot injury onset 1.5 weeks ago. States her grandson dropped a Sales promotion account executive large cup on the middle of the foot. The cup was full of water making it heavier. States the foot is swollen, bruised and painful. Only able to bear weight with a supportive shoe on and compression sock.

## 2023-12-17 NOTE — ED Provider Notes (Signed)
 [facility]    CSN: [id number] Arrival date & time: 12/17/23  1847      History   Chief Complaint Chief Complaint  Patient presents with   Foot Injury    Entered by patient    HPI Monica Vance is a 43 y.o. female.    Foot Injury  Here for pain in her right distal foot.  About 10 days ago her grandson dropped a large Stanley insulated cup with liquid in it on her distal right foot.  Is been painful since and swollen some.  She states the whole foot is numb.  The pain got worse may be 7 days ago.  She has bought a slip on brace to try to help with the pain.  Ibuprofen  600 mg does not help.  She is allergic to penicillin and latex She is postmenopausal She does not take any blood thinners  Past Medical History:  Diagnosis Date   Anxiety    Arthritis    Depression    Fibromyalgia    Hypertension     Patient Active Problem List   Diagnosis Date Noted   Chronic maxillary sinusitis 06/10/2017   Cephalalgia 04/12/2014   Depression 04/12/2014   Sacroiliac dysfunction 02/02/2012   Fibromyalgia 11/30/2011    Past Surgical History:  Procedure Laterality Date   CESAREAN SECTION      OB History   No obstetric history on file.      Home Medications    Prior to Admission medications   Medication Sig Start Date End Date Taking? Authorizing Provider  ketorolac  (TORADOL ) 10 MG tablet Take 1 tablet (10 mg total) by mouth every 6 (six) hours as needed (pain). 12/17/23  Yes Vonna Sharlet POUR, MD    Family History Family History  Problem Relation Age of Onset   COPD Mother    Mental illness Mother    Asthma Mother    COPD Father    Hypertension Father    Cancer Paternal Uncle        lung cancer     Social History Social History   Tobacco Use   Smoking status: Former    Current packs/day: 0.50    Average packs/day: 0.5 packs/day for 20.0 years (10.0 ttl pk-yrs)    Types: Cigarettes   Smokeless tobacco: Never   Tobacco comments:     electronic cigarette  Substance Use Topics   Alcohol use: No    Alcohol/week: 0.0 standard drinks of alcohol   Drug use: No     Allergies   Latex and Penicillins   Review of Systems Review of Systems   Physical Exam Triage Vital Signs ED Triage Vitals  Encounter Vitals Group     BP 12/17/23 1914 112/77     Girls Systolic BP Percentile --      Girls Diastolic BP Percentile --      Boys Systolic BP Percentile --      Boys Diastolic BP Percentile --      Pulse Rate 12/17/23 1914 80     Resp 12/17/23 1914 18     Temp 12/17/23 1914 97.8 F (36.6 C)     Temp Source 12/17/23 1914 Oral     SpO2 12/17/23 1914 98 %     Weight 12/17/23 1913 210 lb (95.3 kg)     Height 12/17/23 1913 5' 3 (1.6 m)     Head Circumference --      Peak Flow --      Pain Score  12/17/23 1913 8     Pain Loc --      Pain Education --      Exclude from Growth Chart --    No data found.  Updated Vital Signs BP 112/77 (BP Location: Left Arm)   Pulse 80   Temp 97.8 F (36.6 C) (Oral)   Resp 18   Ht 5' 3 (1.6 m)   Wt 95.3 kg   LMP 05/02/2018   SpO2 98%   BMI 37.20 kg/m   Visual Acuity Right Eye Distance:   Left Eye Distance:   Bilateral Distance:    Right Eye Near:   Left Eye Near:    Bilateral Near:     Physical Exam Vitals reviewed.  Constitutional:      General: She is not in acute distress.    Appearance: She is not ill-appearing, toxic-appearing or diaphoretic.  Skin:    Coloration: Skin is not jaundiced or pale.  Neurological:     General: No focal deficit present.     Mental Status: She is alert and oriented to person, place, and time.  Psychiatric:        Behavior: Behavior normal.      UC Treatments / Results  Labs (all labs ordered are listed, but only abnormal results are displayed) Labs Reviewed - No data to display  EKG   Radiology DG Foot Complete Right Result Date: 12/17/2023 CLINICAL DATA:  Injury, Swelling, Reduced ROM EXAM: RIGHT FOOT COMPLETE -  3+ VIEW COMPARISON:  None Available. FINDINGS: No acute fracture or dislocation. There is no evidence of arthropathy or other focal bone abnormality. Soft tissues are unremarkable. No radiopaque foreign body. IMPRESSION: No acute fracture or dislocation. Electronically Signed   By: Rogelia Myers M.D.   On: 12/17/2023 19:38    Procedures Procedures (including critical care time)  Medications Ordered in UC Medications  ketorolac  (TORADOL ) 30 MG/ML injection 30 mg (has no administration in time range)    Initial Impression / Assessment and Plan / UC Course  I have reviewed the triage vital signs and the nursing notes.  Pertinent labs & imaging results that were available during my care of the patient were reviewed by me and considered in my medical decision making (see chart for details).     There is no fracture on the foot x-ray.  She already has a good compressive sleeve so I do not think she needs an extra Ace bandage.  Postop shoe is supplied.  Toradol  injection is given here and Toradol  tablets are sent to the pharmacy  She is given contact information for podiatry Final Clinical Impressions(s) / UC Diagnoses   Final diagnoses:  Right foot pain     Discharge Instructions      There were no broken bones on your x-ray.  You have been given a shot of Toradol  30 mg today.  Ketorolac  10 mg tablets--take 1 tablet every 6 hours as needed for pain.  This is the same medicine that is in the shot we just gave you      ED Prescriptions     Medication Sig Dispense Auth. Provider   ketorolac  (TORADOL ) 10 MG tablet Take 1 tablet (10 mg total) by mouth every 6 (six) hours as needed (pain). 20 tablet Kristjan Derner K, MD      PDMP not reviewed this encounter.   Vonna Sharlet POUR, MD 12/17/23 2001

## 2023-12-17 NOTE — Discharge Instructions (Signed)
 There were no broken bones on your x-ray.  You have been given a shot of Toradol  30 mg today.  Ketorolac  10 mg tablets--take 1 tablet every 6 hours as needed for pain.  This is the same medicine that is in the shot we just gave you

## 2024-03-24 ENCOUNTER — Emergency Department (HOSPITAL_COMMUNITY)
Admission: EM | Admit: 2024-03-24 | Discharge: 2024-03-25 | Payer: PRIVATE HEALTH INSURANCE | Attending: Emergency Medicine | Admitting: Emergency Medicine

## 2024-03-24 ENCOUNTER — Encounter (HOSPITAL_COMMUNITY): Payer: Self-pay

## 2024-03-24 DIAGNOSIS — R197 Diarrhea, unspecified: Secondary | ICD-10-CM | POA: Diagnosis not present

## 2024-03-24 DIAGNOSIS — Z5321 Procedure and treatment not carried out due to patient leaving prior to being seen by health care provider: Secondary | ICD-10-CM | POA: Diagnosis not present

## 2024-03-24 DIAGNOSIS — R1031 Right lower quadrant pain: Secondary | ICD-10-CM | POA: Insufficient documentation

## 2024-03-24 DIAGNOSIS — R110 Nausea: Secondary | ICD-10-CM | POA: Insufficient documentation

## 2024-03-24 MED ORDER — ONDANSETRON 4 MG PO TBDP: 4.0000 mg | ORAL_TABLET | Freq: Once | ORAL | Status: DC | PRN

## 2024-03-24 NOTE — ED Triage Notes (Signed)
 Pt has been having RLQ abd pain that has been radiating to her back since yesterday, nausea, pt has been unable to urinate since yesterday and having some diarrhea.

## 2024-03-25 LAB — CBC
HCT: 45.7 % (ref 36.0–46.0)
Hemoglobin: 14.8 g/dL (ref 12.0–15.0)
MCH: 28.3 pg (ref 26.0–34.0)
MCHC: 32.4 g/dL (ref 30.0–36.0)
MCV: 87.4 fL (ref 80.0–100.0)
Platelets: 242 10*3/uL (ref 150–400)
RBC: 5.23 MIL/uL — ABNORMAL HIGH (ref 3.87–5.11)
RDW: 12.4 % (ref 11.5–15.5)
WBC: 8 10*3/uL (ref 4.0–10.5)
nRBC: 0 % (ref 0.0–0.2)

## 2024-03-25 LAB — COMPREHENSIVE METABOLIC PANEL WITH GFR
ALT: 20 U/L (ref 0–44)
AST: 22 U/L (ref 15–41)
Albumin: 4 g/dL (ref 3.5–5.0)
Alkaline Phosphatase: 86 U/L (ref 38–126)
Anion gap: 13 (ref 5–15)
BUN: 17 mg/dL (ref 6–20)
CO2: 20 mmol/L — ABNORMAL LOW (ref 22–32)
Calcium: 10.6 mg/dL — ABNORMAL HIGH (ref 8.9–10.3)
Chloride: 106 mmol/L (ref 98–111)
Creatinine, Ser: 0.8 mg/dL (ref 0.44–1.00)
GFR, Estimated: >60 mL/min
Glucose, Bld: 123 mg/dL — ABNORMAL HIGH (ref 70–99)
Potassium: 3.8 mmol/L (ref 3.5–5.1)
Sodium: 139 mmol/L (ref 135–145)
Total Bilirubin: 0.4 mg/dL (ref 0.0–1.2)
Total Protein: 6.4 g/dL — ABNORMAL LOW (ref 6.5–8.1)

## 2024-03-25 LAB — LIPASE, BLOOD: Lipase: 39 U/L (ref 11–51)

## 2024-03-25 NOTE — ED Notes (Addendum)
 Pt stated she needed to leave. She stated it was starting to snow and she was going to be unable to get home. I did advise patient that it is recommended for her to stay. At this time pt still in ED lobby.

## 2024-03-25 NOTE — ED Notes (Signed)
 Pt left the ED
